# Patient Record
Sex: Male | Born: 1960 | Race: White | Hispanic: No | Marital: Married | Smoking: Never smoker
Health system: Southern US, Community
[De-identification: ages and names within clinical notes are randomized; demographics above are authoritative.]

## PROBLEM LIST (undated history)

## (undated) DIAGNOSIS — J45909 Unspecified asthma, uncomplicated: Secondary | ICD-10-CM

## (undated) DIAGNOSIS — L509 Urticaria, unspecified: Secondary | ICD-10-CM

## (undated) DIAGNOSIS — J309 Allergic rhinitis, unspecified: Secondary | ICD-10-CM

## (undated) DIAGNOSIS — I10 Essential (primary) hypertension: Secondary | ICD-10-CM

## (undated) HISTORY — PX: TONSILLECTOMY: SUR1361

## (undated) HISTORY — PX: ADENOIDECTOMY: SUR15

## (undated) HISTORY — DX: Allergic rhinitis, unspecified: J30.9

## (undated) HISTORY — DX: Essential (primary) hypertension: I10

## (undated) HISTORY — PX: TESTICLE SURGERY: SHX794

## (undated) HISTORY — PX: HERNIA REPAIR: SHX51

## (undated) HISTORY — DX: Unspecified asthma, uncomplicated: J45.909

## (undated) HISTORY — DX: Urticaria, unspecified: L50.9

---

## 2015-02-22 DIAGNOSIS — J309 Allergic rhinitis, unspecified: Secondary | ICD-10-CM

## 2015-02-22 DIAGNOSIS — J3089 Other allergic rhinitis: Secondary | ICD-10-CM | POA: Insufficient documentation

## 2015-02-22 DIAGNOSIS — J4520 Mild intermittent asthma, uncomplicated: Secondary | ICD-10-CM

## 2015-03-22 ENCOUNTER — Ambulatory Visit (INDEPENDENT_AMBULATORY_CARE_PROVIDER_SITE_OTHER): Payer: Medicare Other

## 2015-03-22 DIAGNOSIS — J309 Allergic rhinitis, unspecified: Secondary | ICD-10-CM

## 2015-03-29 ENCOUNTER — Ambulatory Visit (INDEPENDENT_AMBULATORY_CARE_PROVIDER_SITE_OTHER): Payer: Medicare Other

## 2015-03-29 DIAGNOSIS — J309 Allergic rhinitis, unspecified: Secondary | ICD-10-CM | POA: Diagnosis not present

## 2015-04-10 ENCOUNTER — Ambulatory Visit (INDEPENDENT_AMBULATORY_CARE_PROVIDER_SITE_OTHER): Payer: Medicare Other | Admitting: *Deleted

## 2015-04-10 DIAGNOSIS — J309 Allergic rhinitis, unspecified: Secondary | ICD-10-CM | POA: Diagnosis not present

## 2015-05-01 ENCOUNTER — Ambulatory Visit (INDEPENDENT_AMBULATORY_CARE_PROVIDER_SITE_OTHER): Payer: Medicare Other

## 2015-05-01 DIAGNOSIS — J309 Allergic rhinitis, unspecified: Secondary | ICD-10-CM | POA: Diagnosis not present

## 2015-05-18 ENCOUNTER — Ambulatory Visit (INDEPENDENT_AMBULATORY_CARE_PROVIDER_SITE_OTHER): Payer: Medicare Other | Admitting: *Deleted

## 2015-05-18 DIAGNOSIS — J309 Allergic rhinitis, unspecified: Secondary | ICD-10-CM | POA: Diagnosis not present

## 2015-05-26 ENCOUNTER — Encounter: Payer: Self-pay | Admitting: Internal Medicine

## 2015-05-26 ENCOUNTER — Ambulatory Visit (INDEPENDENT_AMBULATORY_CARE_PROVIDER_SITE_OTHER): Payer: Medicare Other | Admitting: Internal Medicine

## 2015-05-26 VITALS — BP 144/96 | HR 80 | Temp 98.0°F | Resp 16 | Ht 66.93 in | Wt 277.3 lb

## 2015-05-26 DIAGNOSIS — J4520 Mild intermittent asthma, uncomplicated: Secondary | ICD-10-CM | POA: Diagnosis not present

## 2015-05-26 DIAGNOSIS — J301 Allergic rhinitis due to pollen: Secondary | ICD-10-CM | POA: Diagnosis not present

## 2015-05-26 MED ORDER — ALBUTEROL SULFATE HFA 108 (90 BASE) MCG/ACT IN AERS: 2.0000 | INHALATION_SPRAY | Freq: Four times a day (QID) | RESPIRATORY_TRACT | Status: DC | PRN

## 2015-05-26 MED ORDER — ALBUTEROL SULFATE HFA 108 (90 BASE) MCG/ACT IN AERS
2.0000 | INHALATION_SPRAY | RESPIRATORY_TRACT | Status: DC | PRN
Start: 2015-05-26 — End: 2016-06-11

## 2015-05-26 NOTE — Progress Notes (Signed)
History of Present Illness: Luis Richardson is a 54 y.o. male presenting for follow-up.  HPI Comments: Allergic rhinitis on immunotherapy: Maintenance reached 07/13/2009. He has not had any shot reactions. He gets injections every 2 weeks because when he goes every 3 weeks, he has increase in rhinitis symptoms. He reports the spring and fall continue to be problematic seasons for him.  Asthma: Symptoms have been stable without any severe exacerbations. He uses albuterol a few times a week for when he is outside working in his dusty garage.   Assessment and Plan: Allergic rhinitis  On immunotherapy, with persistent symptoms especially during the spring and fall  Return for updated allergy skin testing. Stop cetirizine (Zyrtec) 3 days prior.  We'll reformulate shots if necessary  Has EpiPen and action plan-educated on use  For now, continue cetirizine 10 mg daily and fluticasone 2 sprays each nostril daily. Stop fluticasone for any nosebleeds. Use Vaseline to nostrils for bleeding  Intermittent asthma  Intermittent, currently well controlled  Continue as needed Pro Air    Return For updated allergy testing. Please remember to stop antihistamines 3 days prior.  Medications ordered this encounter:  Meds ordered this encounter  Medications  . metFORMIN (GLUCOPHAGE) 500 MG tablet    Sig: TAKE 1 TABLET BY MOUTH EVERY EVENING FORDIABETES  . chlorthalidone (HYGROTON) 25 MG tablet    Sig: Reported on 05/26/2015  . topiramate (TOPAMAX) 50 MG tablet    Sig:   . NONFORMULARY OR COMPOUNDED ITEM    Sig:      Diagnostics: Spirometry: FEV1 3.01 L or 88 %, FEV1/FVC  82 %.  This is a normal study.  Physical Exam: BP 144/96 mmHg  Pulse 80  Temp(Src) 98 F (36.7 C) (Oral)  Resp 16  Ht 5' 6.93" (1.7 m)  Wt 277 lb 5.4 oz (125.8 kg)  BMI 43.53 kg/m2   Physical Exam  Constitutional: He appears well-developed.  HENT:  Nose: Nose normal.  Mouth/Throat: Oropharynx is clear and moist.   Hearing aid in the right ear; hard of hearing  Eyes: Conjunctivae are normal.  Cardiovascular: Normal rate, regular rhythm and normal heart sounds.   No murmur heard. Pulmonary/Chest: Effort normal and breath sounds normal. No respiratory distress. He has no wheezes.  Abdominal: Soft. Bowel sounds are normal.  Musculoskeletal: He exhibits no edema.  Lymphadenopathy:    He has no cervical adenopathy.  Neurological: He is alert.  Skin: No rash noted.  Vitals reviewed.   Medications: Current outpatient prescriptions:  .  albuterol (PROAIR HFA) 108 (90 BASE) MCG/ACT inhaler, Inhale 2 puffs into the lungs every 6 (six) hours as needed for wheezing or shortness of breath., Disp: , Rfl:  .  Azelastine-Fluticasone (DYMISTA) 137-50 MCG/ACT SUSP, Place 2 sprays into the nose at bedtime. , Disp: , Rfl:  .  cetirizine (ZYRTEC) 10 MG tablet, Take 10 mg by mouth daily., Disp: , Rfl:  .  citalopram (CELEXA) 40 MG tablet, Take 40 mg by mouth daily., Disp: , Rfl:  .  EPINEPHrine (EPIPEN 2-PAK) 0.3 mg/0.3 mL IJ SOAJ injection, Inject 0.3 mg into the muscle Once PRN., Disp: , Rfl:  .  metFORMIN (GLUCOPHAGE) 500 MG tablet, TAKE 1 TABLET BY MOUTH EVERY EVENING FORDIABETES, Disp: , Rfl:  .  NONFORMULARY OR COMPOUNDED ITEM, , Disp: , Rfl:  .  simvastatin (ZOCOR) 20 MG tablet, Take 20 mg by mouth daily., Disp: , Rfl:  .  topiramate (TOPAMAX) 50 MG tablet, , Disp: , Rfl:  .  chlorthalidone (HYGROTON) 25 MG tablet, Reported on 05/26/2015, Disp: , Rfl:  .  fluticasone (FLONASE) 50 MCG/ACT nasal spray, Place 2 sprays into both nostrils daily. Reported on 05/26/2015, Disp: , Rfl:  .  indapamide (LOZOL) 1.25 MG tablet, Take 1.25 mg by mouth daily. Reported on 05/26/2015, Disp: , Rfl:   Drug Allergies:  Allergies  Allergen Reactions  . Penicillins     ROS: Per HPI unless specifically indicated below Review of Systems  Thank you for the opportunity to care for this patient.  Please do not hesitate to contact  me with questions.

## 2015-05-26 NOTE — Patient Instructions (Signed)
Allergic rhinitis  On immunotherapy, with persistent symptoms especially during the spring and fall  Return for updated allergy skin testing. Stop cetirizine (Zyrtec) 3 days prior.  We'll reformulate shots if necessary  Has EpiPen and action plan-educated on use  For now, continue cetirizine 10 mg daily and fluticasone 2 sprays each nostril daily. Stop fluticasone for any nosebleeds. Use Vaseline to nostrils for bleeding  Intermittent asthma  Intermittent, currently well controlled  Continue as needed [employer] Air

## 2015-05-26 NOTE — Assessment & Plan Note (Signed)
   On immunotherapy, with persistent symptoms especially during the spring and fall  Return for updated allergy skin testing. Stop cetirizine (Zyrtec) 3 days prior.  We'll reformulate shots if necessary  Has EpiPen and action plan-educated on use  For now, continue cetirizine 10 mg daily and fluticasone 2 sprays each nostril daily. Stop fluticasone for any nosebleeds. Use Vaseline to nostrils for bleeding

## 2015-05-26 NOTE — Assessment & Plan Note (Signed)
   Intermittent, currently well controlled  Continue as needed Pro Air. 

## 2015-06-01 ENCOUNTER — Ambulatory Visit (INDEPENDENT_AMBULATORY_CARE_PROVIDER_SITE_OTHER): Payer: Medicare Other | Admitting: *Deleted

## 2015-06-01 DIAGNOSIS — J309 Allergic rhinitis, unspecified: Secondary | ICD-10-CM

## 2015-06-09 ENCOUNTER — Other Ambulatory Visit: Payer: Self-pay | Admitting: Internal Medicine

## 2015-06-13 ENCOUNTER — Ambulatory Visit (INDEPENDENT_AMBULATORY_CARE_PROVIDER_SITE_OTHER): Payer: Medicare Other | Admitting: *Deleted

## 2015-06-13 DIAGNOSIS — J309 Allergic rhinitis, unspecified: Secondary | ICD-10-CM

## 2015-06-13 DIAGNOSIS — I10 Essential (primary) hypertension: Secondary | ICD-10-CM | POA: Insufficient documentation

## 2015-06-13 DIAGNOSIS — G4733 Obstructive sleep apnea (adult) (pediatric): Secondary | ICD-10-CM | POA: Insufficient documentation

## 2015-06-13 DIAGNOSIS — R040 Epistaxis: Secondary | ICD-10-CM | POA: Insufficient documentation

## 2015-06-19 ENCOUNTER — Ambulatory Visit: Payer: Medicare Other | Admitting: Internal Medicine

## 2015-06-19 DIAGNOSIS — Z6841 Body Mass Index (BMI) 40.0 and over, adult: Secondary | ICD-10-CM | POA: Insufficient documentation

## 2015-06-19 DIAGNOSIS — F5104 Psychophysiologic insomnia: Secondary | ICD-10-CM | POA: Insufficient documentation

## 2015-06-19 DIAGNOSIS — H9193 Unspecified hearing loss, bilateral: Secondary | ICD-10-CM | POA: Insufficient documentation

## 2015-06-19 DIAGNOSIS — M1991 Primary osteoarthritis, unspecified site: Secondary | ICD-10-CM | POA: Insufficient documentation

## 2015-06-19 DIAGNOSIS — E119 Type 2 diabetes mellitus without complications: Secondary | ICD-10-CM | POA: Insufficient documentation

## 2015-06-19 DIAGNOSIS — G44229 Chronic tension-type headache, not intractable: Secondary | ICD-10-CM | POA: Insufficient documentation

## 2015-06-19 DIAGNOSIS — E782 Mixed hyperlipidemia: Secondary | ICD-10-CM | POA: Insufficient documentation

## 2015-07-03 ENCOUNTER — Encounter: Payer: Self-pay | Admitting: Internal Medicine

## 2015-07-03 ENCOUNTER — Ambulatory Visit (INDEPENDENT_AMBULATORY_CARE_PROVIDER_SITE_OTHER): Payer: Medicare Other | Admitting: Internal Medicine

## 2015-07-03 VITALS — BP 140/76 | HR 76 | Temp 97.8°F | Resp 16

## 2015-07-03 DIAGNOSIS — T7800XA Anaphylactic reaction due to unspecified food, initial encounter: Secondary | ICD-10-CM

## 2015-07-03 DIAGNOSIS — J4520 Mild intermittent asthma, uncomplicated: Secondary | ICD-10-CM

## 2015-07-03 DIAGNOSIS — J309 Allergic rhinitis, unspecified: Secondary | ICD-10-CM | POA: Diagnosis not present

## 2015-07-03 MED ORDER — MONTELUKAST SODIUM 10 MG PO TABS: 10.0000 mg | ORAL_TABLET | Freq: Every day | ORAL | Status: DC

## 2015-07-03 NOTE — Progress Notes (Signed)
History of Present Illness: Luis Richardson is a 55 y.o. male presenting for updated allergy testing.  HPI Comments: Allergic rhinitis on immunotherapy: Maintenance reached 07/13/2009. Please see last office visit for full details. He is here today to update his allergy testing due to continued breakthrough symptoms despite having completed a 6 year course.  Asthma: Symptoms have been stable without any severe exacerbations. He uses albuterol a few times a week for when he is outside working in his dusty garage.   Assessment and Plan: Allergic rhinitis  On immunotherapy, with persistent symptoms especially during the spring and fall.  Reformulate allergy vaccine  Has EpiPen and action plan-educated on use  Continue cetirizine 10 mg daily and fluticasone 2 sprays each nostril daily. Stop fluticasone for any nosebleeds. Use Vaseline to nostrils for bleeding  Start Singulair (montelukast) 10 mg daily as needed in the spring and fall for breakthrough allergy symptoms  Intermittent asthma  Intermittent, currently well controlled  Continue as needed Pro Air   Allergy with anaphylaxis due to food  Strict avoidance of oranges, lemon, grapes  Given EpiPen and action plan-educated on use    Return in about 6 months (around 12/31/2015).  Medications ordered this encounter:  Meds ordered this encounter  Medications  . azelastine (OPTIVAR) 0.05 % ophthalmic solution    Sig: Place 1 drop into both eyes 2 times daily.  Letta Pate DELICA LANCETS 33G MISC    Sig: CHECK BLOOD SUGAR ONCE PER DAY OR AS DIRECTED (DX:E11.9)  . glucose blood (ONE TOUCH ULTRA TEST) test strip    Sig: CHECK BLOOD SUGAR ONCE PER DAY OR AS DIRECTED (DX:E11.9)  . mupirocin ointment (BACTROBAN) 2 %    Sig: Apply small dab twice daily as instructed for 10 days in both sides of the nose  . DISCONTD: azelastine (OPTIVAR) 0.05 % ophthalmic solution    Sig:   . montelukast (SINGULAIR) 10 MG tablet    Sig: Take 1 tablet  (10 mg total) by mouth at bedtime.    Dispense:  30 tablet    Refill:  5    For cough or wheeze    Diagnostics: Spirometry: FEV1 3.04L or 85%, FEV1/FVC  80%.  This is a normal study Aeroallergen skin testing: Positive for grass, weed, mold, dust, cat, cockroach with a good histamine control Food allergy skin testing: Positive for orange and grape with a good histamine control  Physical Exam: BP 140/76 mmHg  Pulse 76  Temp(Src) 97.8 F (36.6 C) (Oral)  Resp 16   Physical Exam  Constitutional: He appears well-developed.  HENT:  Nose: Nose normal.  Mouth/Throat: Oropharynx is clear and moist.  Poor hearing  Eyes: Conjunctivae are normal.  Cardiovascular: Normal rate, regular rhythm and normal heart sounds.   No murmur heard. Pulmonary/Chest: Effort normal and breath sounds normal. No respiratory distress. He has no wheezes.  Abdominal: Soft. Bowel sounds are normal.  Musculoskeletal: He exhibits no edema.  Lymphadenopathy:    He has no cervical adenopathy.  Neurological: He is alert.  Skin: No rash noted.  Vitals reviewed.   Medications: Current outpatient prescriptions:  .  albuterol (PROAIR HFA) 108 (90 BASE) MCG/ACT inhaler, Inhale 2 puffs into the lungs every 4 (four) hours as needed for wheezing or shortness of breath., Disp: 1 Inhaler, Rfl: 1 .  azelastine (OPTIVAR) 0.05 % ophthalmic solution, Place 1 drop into both eyes 2 times daily., Disp: , Rfl:  .  cetirizine (ZYRTEC) 10 MG tablet, TAKE 1 TABLET BY MOUTH EVERY  DAY FOR RUNNY NOSE OR ITCHING, Disp: 30 tablet, Rfl: 0 .  chlorthalidone (HYGROTON) 25 MG tablet, Reported on 05/26/2015, Disp: , Rfl:  .  citalopram (CELEXA) 40 MG tablet, Take 40 mg by mouth daily., Disp: , Rfl:  .  EPINEPHrine (EPIPEN 2-PAK) 0.3 mg/0.3 mL IJ SOAJ injection, Inject 0.3 mg into the muscle Once PRN., Disp: , Rfl:  .  fluticasone (FLONASE) 50 MCG/ACT nasal spray, ONE SPRAY IN EACH NOSTRIL TWICE A DAY, Disp: 16 g, Rfl: 0 .  glucose blood (ONE  TOUCH ULTRA TEST) test strip, CHECK BLOOD SUGAR ONCE PER DAY OR AS DIRECTED (DX:E11.9), Disp: , Rfl:  .  indapamide (LOZOL) 1.25 MG tablet, Take 1.25 mg by mouth daily. Reported on 07/03/2015, Disp: , Rfl:  .  metFORMIN (GLUCOPHAGE) 500 MG tablet, TAKE 1 TABLET BY MOUTH EVERY EVENING FORDIABETES, Disp: , Rfl:  .  mupirocin ointment (BACTROBAN) 2 %, Apply small dab twice daily as instructed for 10 days in both sides of the nose, Disp: , Rfl:  .  NONFORMULARY OR COMPOUNDED ITEM, , Disp: , Rfl:  .  ONETOUCH DELICA LANCETS 33G MISC, CHECK BLOOD SUGAR ONCE PER DAY OR AS DIRECTED (DX:E11.9), Disp: , Rfl:  .  simvastatin (ZOCOR) 20 MG tablet, Take 20 mg by mouth daily., Disp: , Rfl:  .  Azelastine-Fluticasone (DYMISTA) 137-50 MCG/ACT SUSP, Place 2 sprays into the nose at bedtime. Reported on 07/03/2015, Disp: , Rfl:  .  montelukast (SINGULAIR) 10 MG tablet, Take 1 tablet (10 mg total) by mouth at bedtime., Disp: 30 tablet, Rfl: 5 .  topiramate (TOPAMAX) 50 MG tablet, Reported on 07/03/2015, Disp: , Rfl:   Drug Allergies:  Allergies  Allergen Reactions  . Penicillins   . Topiramate Other (See Comments)    ROS: Per HPI unless specifically indicated below Review of Systems  Thank you for the opportunity to care for this patient.  Please do not hesitate to contact me with questions.

## 2015-07-03 NOTE — Assessment & Plan Note (Addendum)
   Intermittent, currently well controlled  Continue as needed Pro Air. 

## 2015-07-03 NOTE — Assessment & Plan Note (Addendum)
   On immunotherapy, with persistent symptoms especially during the spring and fall.  Reformulate allergy vaccine  Has EpiPen and action plan-educated on use  Continue cetirizine 10 mg daily and fluticasone 2 sprays each nostril daily. Stop fluticasone for any nosebleeds. Use Vaseline to nostrils for bleeding  Start Singulair (montelukast) 10 mg daily as needed in the spring and fall for breakthrough allergy symptoms

## 2015-07-03 NOTE — Patient Instructions (Signed)
Allergic rhinitis  On immunotherapy, with persistent symptoms especially during the spring and fall.  Reformulate allergy vaccine  Has EpiPen and action plan-educated on use  Continue cetirizine 10 mg daily and fluticasone 2 sprays each nostril daily. Stop fluticasone for any nosebleeds. Use Vaseline to nostrils for bleeding  Start Singulair (montelukast) 10 mg daily as needed in the spring and fall for breakthrough allergy symptoms  Intermittent asthma  Intermittent, currently well controlled  Continue as needed Pro Air   Allergy with anaphylaxis due to food  Strict avoidance of oranges, lemon, grapes  Given EpiPen and action plan-educated on use

## 2015-07-03 NOTE — Assessment & Plan Note (Signed)
   Strict avoidance of oranges, lemon, grapes  Given EpiPen and action plan-educated on use

## 2015-07-05 ENCOUNTER — Ambulatory Visit (INDEPENDENT_AMBULATORY_CARE_PROVIDER_SITE_OTHER): Payer: Medicare Other | Admitting: *Deleted

## 2015-07-05 DIAGNOSIS — J309 Allergic rhinitis, unspecified: Secondary | ICD-10-CM

## 2015-07-06 DIAGNOSIS — J3089 Other allergic rhinitis: Secondary | ICD-10-CM | POA: Diagnosis not present

## 2015-07-06 DIAGNOSIS — H903 Sensorineural hearing loss, bilateral: Secondary | ICD-10-CM | POA: Insufficient documentation

## 2015-07-07 DIAGNOSIS — J301 Allergic rhinitis due to pollen: Secondary | ICD-10-CM | POA: Diagnosis not present

## 2015-07-20 ENCOUNTER — Ambulatory Visit: Payer: Medicare Other

## 2015-07-20 ENCOUNTER — Ambulatory Visit (INDEPENDENT_AMBULATORY_CARE_PROVIDER_SITE_OTHER): Payer: Medicare Other

## 2015-07-20 DIAGNOSIS — J309 Allergic rhinitis, unspecified: Secondary | ICD-10-CM | POA: Diagnosis not present

## 2015-07-26 ENCOUNTER — Ambulatory Visit (INDEPENDENT_AMBULATORY_CARE_PROVIDER_SITE_OTHER): Payer: Medicare Other

## 2015-07-26 DIAGNOSIS — J309 Allergic rhinitis, unspecified: Secondary | ICD-10-CM | POA: Diagnosis not present

## 2015-08-02 ENCOUNTER — Ambulatory Visit (INDEPENDENT_AMBULATORY_CARE_PROVIDER_SITE_OTHER): Payer: Medicare Other

## 2015-08-02 DIAGNOSIS — J309 Allergic rhinitis, unspecified: Secondary | ICD-10-CM

## 2015-08-02 NOTE — Progress Notes (Signed)
Immunotherapy   Patient Details  Name: Luis Richardson MRN: [id number] Date of Birth: Jun 13, 1960  08/02/2015  Luis Richardson started injections for  BLUE 1:100,000 (GRASS-CAT AND WEED-MOLD-MITE-CR) Following schedule: A  Frequency:1 time per week Epi-Pen:Epi-Pen Avaiable  Consent signed and patient instructions given. No problems after 30 minute wait  Luis Richardson 08/02/2015, 2:07 PM

## 2015-08-09 ENCOUNTER — Ambulatory Visit (INDEPENDENT_AMBULATORY_CARE_PROVIDER_SITE_OTHER): Payer: Medicare Other

## 2015-08-09 DIAGNOSIS — J309 Allergic rhinitis, unspecified: Secondary | ICD-10-CM | POA: Diagnosis not present

## 2015-08-16 ENCOUNTER — Ambulatory Visit (INDEPENDENT_AMBULATORY_CARE_PROVIDER_SITE_OTHER): Payer: Medicare Other | Admitting: *Deleted

## 2015-08-16 DIAGNOSIS — J309 Allergic rhinitis, unspecified: Secondary | ICD-10-CM

## 2015-08-23 ENCOUNTER — Ambulatory Visit (INDEPENDENT_AMBULATORY_CARE_PROVIDER_SITE_OTHER): Payer: Medicare Other

## 2015-08-23 DIAGNOSIS — J309 Allergic rhinitis, unspecified: Secondary | ICD-10-CM | POA: Diagnosis not present

## 2015-08-30 ENCOUNTER — Ambulatory Visit (INDEPENDENT_AMBULATORY_CARE_PROVIDER_SITE_OTHER): Payer: Medicare Other | Admitting: *Deleted

## 2015-08-30 DIAGNOSIS — J309 Allergic rhinitis, unspecified: Secondary | ICD-10-CM | POA: Diagnosis not present

## 2015-09-06 ENCOUNTER — Ambulatory Visit (INDEPENDENT_AMBULATORY_CARE_PROVIDER_SITE_OTHER): Payer: Medicare Other

## 2015-09-06 DIAGNOSIS — J309 Allergic rhinitis, unspecified: Secondary | ICD-10-CM

## 2015-09-11 DIAGNOSIS — F419 Anxiety disorder, unspecified: Secondary | ICD-10-CM | POA: Insufficient documentation

## 2015-09-13 ENCOUNTER — Ambulatory Visit (INDEPENDENT_AMBULATORY_CARE_PROVIDER_SITE_OTHER): Payer: Medicare Other | Admitting: *Deleted

## 2015-09-13 DIAGNOSIS — J309 Allergic rhinitis, unspecified: Secondary | ICD-10-CM | POA: Diagnosis not present

## 2015-09-20 ENCOUNTER — Ambulatory Visit (INDEPENDENT_AMBULATORY_CARE_PROVIDER_SITE_OTHER): Payer: Medicare Other

## 2015-09-20 DIAGNOSIS — J309 Allergic rhinitis, unspecified: Secondary | ICD-10-CM

## 2015-09-27 ENCOUNTER — Ambulatory Visit (INDEPENDENT_AMBULATORY_CARE_PROVIDER_SITE_OTHER): Payer: Medicare Other

## 2015-09-27 DIAGNOSIS — J309 Allergic rhinitis, unspecified: Secondary | ICD-10-CM | POA: Diagnosis not present

## 2015-10-04 ENCOUNTER — Ambulatory Visit (INDEPENDENT_AMBULATORY_CARE_PROVIDER_SITE_OTHER): Payer: Medicare Other

## 2015-10-04 DIAGNOSIS — J309 Allergic rhinitis, unspecified: Secondary | ICD-10-CM

## 2015-10-11 ENCOUNTER — Ambulatory Visit (INDEPENDENT_AMBULATORY_CARE_PROVIDER_SITE_OTHER): Payer: Medicare Other

## 2015-10-11 DIAGNOSIS — J309 Allergic rhinitis, unspecified: Secondary | ICD-10-CM | POA: Diagnosis not present

## 2015-10-18 ENCOUNTER — Ambulatory Visit (INDEPENDENT_AMBULATORY_CARE_PROVIDER_SITE_OTHER): Payer: Medicare Other

## 2015-10-18 DIAGNOSIS — J309 Allergic rhinitis, unspecified: Secondary | ICD-10-CM

## 2015-10-25 ENCOUNTER — Ambulatory Visit (INDEPENDENT_AMBULATORY_CARE_PROVIDER_SITE_OTHER): Payer: Medicare Other

## 2015-10-25 DIAGNOSIS — J309 Allergic rhinitis, unspecified: Secondary | ICD-10-CM

## 2015-11-01 ENCOUNTER — Ambulatory Visit (INDEPENDENT_AMBULATORY_CARE_PROVIDER_SITE_OTHER): Payer: Medicare Other

## 2015-11-01 DIAGNOSIS — J309 Allergic rhinitis, unspecified: Secondary | ICD-10-CM | POA: Diagnosis not present

## 2015-11-08 ENCOUNTER — Ambulatory Visit (INDEPENDENT_AMBULATORY_CARE_PROVIDER_SITE_OTHER): Payer: Medicare Other

## 2015-11-08 DIAGNOSIS — J309 Allergic rhinitis, unspecified: Secondary | ICD-10-CM

## 2015-11-09 ENCOUNTER — Other Ambulatory Visit: Payer: Self-pay | Admitting: Allergy

## 2015-11-09 MED ORDER — MONTELUKAST SODIUM 10 MG PO TABS: 10.0000 mg | ORAL_TABLET | Freq: Every day | ORAL | Status: DC

## 2015-11-15 ENCOUNTER — Ambulatory Visit (INDEPENDENT_AMBULATORY_CARE_PROVIDER_SITE_OTHER): Payer: Medicare Other

## 2015-11-15 DIAGNOSIS — J309 Allergic rhinitis, unspecified: Secondary | ICD-10-CM | POA: Diagnosis not present

## 2015-11-22 ENCOUNTER — Ambulatory Visit (INDEPENDENT_AMBULATORY_CARE_PROVIDER_SITE_OTHER): Payer: Medicare Other

## 2015-11-22 DIAGNOSIS — J309 Allergic rhinitis, unspecified: Secondary | ICD-10-CM

## 2015-11-29 ENCOUNTER — Ambulatory Visit (INDEPENDENT_AMBULATORY_CARE_PROVIDER_SITE_OTHER): Payer: Medicare Other

## 2015-11-29 DIAGNOSIS — J309 Allergic rhinitis, unspecified: Secondary | ICD-10-CM | POA: Diagnosis not present

## 2015-12-06 ENCOUNTER — Ambulatory Visit (INDEPENDENT_AMBULATORY_CARE_PROVIDER_SITE_OTHER): Payer: Medicare Other

## 2015-12-06 DIAGNOSIS — J309 Allergic rhinitis, unspecified: Secondary | ICD-10-CM

## 2015-12-13 ENCOUNTER — Ambulatory Visit (INDEPENDENT_AMBULATORY_CARE_PROVIDER_SITE_OTHER): Payer: Medicare Other

## 2015-12-13 DIAGNOSIS — J309 Allergic rhinitis, unspecified: Secondary | ICD-10-CM | POA: Diagnosis not present

## 2015-12-19 DIAGNOSIS — M1990 Unspecified osteoarthritis, unspecified site: Secondary | ICD-10-CM | POA: Insufficient documentation

## 2015-12-19 DIAGNOSIS — E291 Testicular hypofunction: Secondary | ICD-10-CM | POA: Insufficient documentation

## 2015-12-20 ENCOUNTER — Ambulatory Visit (INDEPENDENT_AMBULATORY_CARE_PROVIDER_SITE_OTHER): Payer: Medicare Other

## 2015-12-20 DIAGNOSIS — J309 Allergic rhinitis, unspecified: Secondary | ICD-10-CM | POA: Diagnosis not present

## 2015-12-25 ENCOUNTER — Ambulatory Visit: Payer: Medicare Other | Admitting: Pediatrics

## 2015-12-26 DIAGNOSIS — L918 Other hypertrophic disorders of the skin: Secondary | ICD-10-CM | POA: Insufficient documentation

## 2015-12-27 ENCOUNTER — Ambulatory Visit (INDEPENDENT_AMBULATORY_CARE_PROVIDER_SITE_OTHER): Payer: Medicare Other

## 2015-12-27 DIAGNOSIS — J309 Allergic rhinitis, unspecified: Secondary | ICD-10-CM

## 2016-01-01 ENCOUNTER — Ambulatory Visit: Payer: Medicare Other | Admitting: Pediatrics

## 2016-01-03 ENCOUNTER — Ambulatory Visit (INDEPENDENT_AMBULATORY_CARE_PROVIDER_SITE_OTHER): Payer: Medicare Other

## 2016-01-03 DIAGNOSIS — J309 Allergic rhinitis, unspecified: Secondary | ICD-10-CM

## 2016-01-09 ENCOUNTER — Encounter: Payer: Self-pay | Admitting: Pediatrics

## 2016-01-09 ENCOUNTER — Ambulatory Visit (INDEPENDENT_AMBULATORY_CARE_PROVIDER_SITE_OTHER): Payer: Medicare Other | Admitting: Pediatrics

## 2016-01-09 VITALS — BP 144/94 | HR 64 | Temp 98.2°F | Resp 16

## 2016-01-09 DIAGNOSIS — J301 Allergic rhinitis due to pollen: Secondary | ICD-10-CM

## 2016-01-09 DIAGNOSIS — J309 Allergic rhinitis, unspecified: Secondary | ICD-10-CM | POA: Diagnosis not present

## 2016-01-09 DIAGNOSIS — J4530 Mild persistent asthma, uncomplicated: Secondary | ICD-10-CM

## 2016-01-09 DIAGNOSIS — J4540 Moderate persistent asthma, uncomplicated: Secondary | ICD-10-CM | POA: Insufficient documentation

## 2016-01-09 DIAGNOSIS — J45901 Unspecified asthma with (acute) exacerbation: Secondary | ICD-10-CM

## 2016-01-09 MED ORDER — EPINEPHRINE 0.3 MG/0.3ML IJ SOAJ: INTRAMUSCULAR | 1 refills | Status: DC

## 2016-01-09 MED ORDER — FLUTICASONE PROPIONATE 50 MCG/ACT NA SUSP: 2.0000 | Freq: Every day | NASAL | 3 refills | Status: DC

## 2016-01-09 NOTE — Progress Notes (Signed)
  [address] [place] [address] Dept: [phone]  FOLLOW UP NOTE  Patient ID: Luis Richardson, male    DOB: August 28, 1960  Age: 55 y.o. MRN: [id number] Date of Office Visit: 01/09/2016  Assessment  Chief Complaint: Asthma (doing  no cough or wheeze the last 2 weeks)  HPI [name] presents for follow-up of asthma and allergic rhinitis. His asthma is well controlled. His allergic rhinitis is well controlled with allergy injections. He is on green vials of allergy extract.  Current medications cetirizine 10 mg once a day, Pro-air 2 puffs every 4 hours if needed, montelukast 10 mg once a day, fluticasone 2 sprays per nostril once a day if needed and azelastine 0.05% one drop in each eye twice a day if needed.   Drug Allergies:  Allergies  Allergen Reactions  . Penicillins   . Topiramate Other (See Comments)    Physical Exam: BP (!) 144/94 (BP Location: Left Arm, Patient Position: Sitting, Cuff Size: Large)   Pulse 64   Temp 98.2 F (36.8 C) (Oral)   Resp 16    Physical Exam  Constitutional: He is oriented to person, place, and time. He appears well-developed and well-nourished.  HENT:  Eyes normal. Ears normal. Nose normal. Pharynx normal.  Neck: Neck supple.  Cardiovascular:  S1 and S2 normal no murmurs  Pulmonary/Chest:  Clear to percussion and auscultation  Lymphadenopathy:    He has no cervical adenopathy.  Neurological: He is alert and oriented to person, place, and time.  Psychiatric: He has a normal mood and affect. His behavior is normal. Judgment and thought content normal.  Vitals reviewed.   Diagnostics:  FVC 3.52 L FEV1 2.87 L. Predicted FVC 4.42 L predicted FEV1 3.39 L-this shows a minimal reduction in the forced vital capacity  Assessment and Plan: 1. Mild persistent asthma, uncomplicated   2. Allergic rhinitis, unspecified allergic rhinitis type   3. Allergic rhinitis due to pollen     Meds ordered this encounter  Medications  .  fluticasone (FLONASE) 50 MCG/ACT nasal spray    Sig: Place 2 sprays into both nostrils daily.    Dispense:  48 g    Refill:  3    For stuffy nose or drainage. Please dispense 90 day supply.  Marland Kitchen EPINEPHrine 0.3 mg/0.3 mL IJ SOAJ injection    Sig: Use as directed for severe allergic reaction    Dispense:  1 Device    Refill:  1    Dispense mylan generic brand only. Pt. Has coupon.    Patient Instructions  Continue on his current medications Call me if he is not doing well on this treatment plan   Return in about 1 year (around 01/08/2017).    Thank you for the opportunity to care for this patient.  Please do not hesitate to contact me with questions.  Tonette Bihari, M.D.  [facility] [address] [place], [place] [address] [phone]

## 2016-01-09 NOTE — Patient Instructions (Signed)
Continue on his current medications Call me if he is not doing well on this treatment plan  

## 2016-01-16 ENCOUNTER — Ambulatory Visit (INDEPENDENT_AMBULATORY_CARE_PROVIDER_SITE_OTHER): Payer: Medicare Other

## 2016-01-16 DIAGNOSIS — J309 Allergic rhinitis, unspecified: Secondary | ICD-10-CM | POA: Diagnosis not present

## 2016-01-23 ENCOUNTER — Ambulatory Visit (INDEPENDENT_AMBULATORY_CARE_PROVIDER_SITE_OTHER): Payer: Medicare Other

## 2016-01-23 DIAGNOSIS — J309 Allergic rhinitis, unspecified: Secondary | ICD-10-CM

## 2016-01-30 ENCOUNTER — Ambulatory Visit (INDEPENDENT_AMBULATORY_CARE_PROVIDER_SITE_OTHER): Payer: Medicare Other

## 2016-01-30 DIAGNOSIS — J309 Allergic rhinitis, unspecified: Secondary | ICD-10-CM

## 2016-02-02 DIAGNOSIS — I493 Ventricular premature depolarization: Secondary | ICD-10-CM | POA: Insufficient documentation

## 2016-02-13 ENCOUNTER — Ambulatory Visit (INDEPENDENT_AMBULATORY_CARE_PROVIDER_SITE_OTHER): Payer: Medicare Other

## 2016-02-13 DIAGNOSIS — J309 Allergic rhinitis, unspecified: Secondary | ICD-10-CM | POA: Diagnosis not present

## 2016-02-20 ENCOUNTER — Ambulatory Visit (INDEPENDENT_AMBULATORY_CARE_PROVIDER_SITE_OTHER): Payer: Medicare Other

## 2016-02-20 DIAGNOSIS — J309 Allergic rhinitis, unspecified: Secondary | ICD-10-CM

## 2016-02-27 ENCOUNTER — Ambulatory Visit (INDEPENDENT_AMBULATORY_CARE_PROVIDER_SITE_OTHER): Payer: Medicare Other

## 2016-02-27 DIAGNOSIS — J309 Allergic rhinitis, unspecified: Secondary | ICD-10-CM

## 2016-03-05 ENCOUNTER — Ambulatory Visit (INDEPENDENT_AMBULATORY_CARE_PROVIDER_SITE_OTHER): Payer: Medicare Other

## 2016-03-05 DIAGNOSIS — J309 Allergic rhinitis, unspecified: Secondary | ICD-10-CM | POA: Diagnosis not present

## 2016-03-12 ENCOUNTER — Ambulatory Visit (INDEPENDENT_AMBULATORY_CARE_PROVIDER_SITE_OTHER): Payer: Medicare Other

## 2016-03-12 DIAGNOSIS — J309 Allergic rhinitis, unspecified: Secondary | ICD-10-CM | POA: Diagnosis not present

## 2016-03-19 ENCOUNTER — Ambulatory Visit (INDEPENDENT_AMBULATORY_CARE_PROVIDER_SITE_OTHER): Payer: Medicare Other

## 2016-03-19 DIAGNOSIS — J309 Allergic rhinitis, unspecified: Secondary | ICD-10-CM | POA: Diagnosis not present

## 2016-03-26 ENCOUNTER — Ambulatory Visit (INDEPENDENT_AMBULATORY_CARE_PROVIDER_SITE_OTHER): Payer: Medicare Other

## 2016-03-26 DIAGNOSIS — J309 Allergic rhinitis, unspecified: Secondary | ICD-10-CM | POA: Diagnosis not present

## 2016-04-02 ENCOUNTER — Ambulatory Visit (INDEPENDENT_AMBULATORY_CARE_PROVIDER_SITE_OTHER): Payer: Medicare Other | Admitting: *Deleted

## 2016-04-02 DIAGNOSIS — J309 Allergic rhinitis, unspecified: Secondary | ICD-10-CM | POA: Diagnosis not present

## 2016-04-09 ENCOUNTER — Ambulatory Visit (INDEPENDENT_AMBULATORY_CARE_PROVIDER_SITE_OTHER): Payer: Medicare Other

## 2016-04-09 DIAGNOSIS — J309 Allergic rhinitis, unspecified: Secondary | ICD-10-CM | POA: Diagnosis not present

## 2016-04-16 ENCOUNTER — Ambulatory Visit (INDEPENDENT_AMBULATORY_CARE_PROVIDER_SITE_OTHER): Payer: Medicare Other

## 2016-04-16 DIAGNOSIS — J309 Allergic rhinitis, unspecified: Secondary | ICD-10-CM

## 2016-04-23 ENCOUNTER — Ambulatory Visit (INDEPENDENT_AMBULATORY_CARE_PROVIDER_SITE_OTHER): Payer: Medicare Other

## 2016-04-23 DIAGNOSIS — J309 Allergic rhinitis, unspecified: Secondary | ICD-10-CM | POA: Diagnosis not present

## 2016-04-30 ENCOUNTER — Ambulatory Visit (INDEPENDENT_AMBULATORY_CARE_PROVIDER_SITE_OTHER): Payer: Medicare Other

## 2016-04-30 DIAGNOSIS — J309 Allergic rhinitis, unspecified: Secondary | ICD-10-CM | POA: Diagnosis not present

## 2016-05-07 ENCOUNTER — Ambulatory Visit (INDEPENDENT_AMBULATORY_CARE_PROVIDER_SITE_OTHER): Payer: Medicare Other

## 2016-05-07 DIAGNOSIS — J309 Allergic rhinitis, unspecified: Secondary | ICD-10-CM | POA: Diagnosis not present

## 2016-05-14 ENCOUNTER — Ambulatory Visit (INDEPENDENT_AMBULATORY_CARE_PROVIDER_SITE_OTHER): Payer: Medicare Other

## 2016-05-14 DIAGNOSIS — J309 Allergic rhinitis, unspecified: Secondary | ICD-10-CM | POA: Diagnosis not present

## 2016-05-21 ENCOUNTER — Ambulatory Visit (INDEPENDENT_AMBULATORY_CARE_PROVIDER_SITE_OTHER): Payer: Medicare Other

## 2016-05-21 DIAGNOSIS — J309 Allergic rhinitis, unspecified: Secondary | ICD-10-CM

## 2016-05-28 ENCOUNTER — Ambulatory Visit (INDEPENDENT_AMBULATORY_CARE_PROVIDER_SITE_OTHER): Payer: Medicare Other

## 2016-05-28 DIAGNOSIS — J309 Allergic rhinitis, unspecified: Secondary | ICD-10-CM

## 2016-05-30 DIAGNOSIS — J3089 Other allergic rhinitis: Secondary | ICD-10-CM | POA: Diagnosis not present

## 2016-05-31 DIAGNOSIS — J301 Allergic rhinitis due to pollen: Secondary | ICD-10-CM | POA: Diagnosis not present

## 2016-06-05 ENCOUNTER — Ambulatory Visit (INDEPENDENT_AMBULATORY_CARE_PROVIDER_SITE_OTHER): Payer: Medicare Other | Admitting: *Deleted

## 2016-06-05 DIAGNOSIS — J4540 Moderate persistent asthma, uncomplicated: Secondary | ICD-10-CM | POA: Diagnosis not present

## 2016-06-07 ENCOUNTER — Other Ambulatory Visit: Payer: Self-pay | Admitting: *Deleted

## 2016-06-07 MED ORDER — MONTELUKAST SODIUM 10 MG PO TABS: 10.0000 mg | ORAL_TABLET | Freq: Every day | ORAL | 5 refills | Status: DC

## 2016-06-11 ENCOUNTER — Encounter: Payer: Self-pay | Admitting: Allergy

## 2016-06-11 ENCOUNTER — Ambulatory Visit (INDEPENDENT_AMBULATORY_CARE_PROVIDER_SITE_OTHER): Payer: Medicare Other | Admitting: Allergy

## 2016-06-11 VITALS — BP 110/72 | HR 77 | Temp 97.8°F | Resp 16 | Ht 66.0 in

## 2016-06-11 DIAGNOSIS — J4531 Mild persistent asthma with (acute) exacerbation: Secondary | ICD-10-CM | POA: Diagnosis not present

## 2016-06-11 DIAGNOSIS — J00 Acute nasopharyngitis [common cold]: Secondary | ICD-10-CM | POA: Diagnosis not present

## 2016-06-11 MED ORDER — MUPIROCIN 2 % EX OINT: TOPICAL_OINTMENT | CUTANEOUS | 2 refills | Status: DC

## 2016-06-11 MED ORDER — ALBUTEROL SULFATE HFA 108 (90 BASE) MCG/ACT IN AERS: 2.0000 | INHALATION_SPRAY | Freq: Four times a day (QID) | RESPIRATORY_TRACT | 2 refills | Status: DC | PRN

## 2016-06-11 MED ORDER — AZITHROMYCIN 250 MG PO TABS: ORAL_TABLET | ORAL | 0 refills | Status: DC

## 2016-06-11 NOTE — Patient Instructions (Addendum)
Asthma exacerbation with upper respiratory tract infection    - take prednisone 20mg  twice a day x 3 days, then 20mg  x 1 day, then 10mg  x 1 day.    - If you are not feeling better by Thursday then start Azthromycin (z-pak) 500mg  x day 1, then 250mg  x day 2-5    - take your albuterol (proair) 2 puffs every 4 hours for the next 2-3 days then resume as needed use.      - continue your current regimen of Singulair 10mg  daily, Cetirizine 10mg  daily, fluticasone nasal spray 2 sprays each nostril.        Start using your nasal saline rinses followed by your Fluticasone    - let us know if your symptoms worsen/not improving over the next several days.    Follow-up with Dr. Beaulah DinningBardelas in 2-3 months or sooner if needed

## 2016-06-11 NOTE — Progress Notes (Signed)
Follow-up Note  RE: Luis Richardson MRN: [id number] DOB: January 31, 1961 Date of Office Visit: 06/11/2016   History of present illness: Luis Richardson is a 55 y.o. male presenting today for sick visit.  He was last in our office by Dr. Beaulah Dinning for asthma,  Allergic rhinitis. He is on allergen immunotherapy and comes weekly.   He presents today as he has had increased cough as well as chest pain with the cough. Cough wakes him up at night.  He is also wheezing. He started using his ProAir this morning. He also complains of increased nasal drainage that is green he is also coughed up some green mucus. He also feels that there is pressure in his forehead. Last night he reports having some sweats and chills. Symptoms started over the past 2-3 days.   He has continued to take his regular medications which include cetirizine 10 mg, montelukast 10 mg, fluticasone 2 sprays each nostril as well as as needed azelastine eye drop.    He is not sure if he has been around any sick exposures.    Review of systems: Review of Systems  Constitutional: Positive for chills, diaphoresis and malaise/fatigue.  HENT: Positive for congestion, sinus pain and sore throat. Negative for ear pain and nosebleeds.   Eyes: Negative for discharge and redness.  Respiratory: Positive for cough, sputum production, shortness of breath and wheezing.   Cardiovascular: Positive for chest pain. Negative for palpitations.  Gastrointestinal: Negative for abdominal pain, nausea and vomiting.  Skin: Negative for itching and rash.    All other systems negative unless noted above in HPI  Past medical/social/surgical/family history have been reviewed and are unchanged unless specifically indicated below.  No changes  Medication List: Allergies as of 06/11/2016      Reactions   Grapefruit Extract Other (See Comments)   Blisters all over body   Lemon Oil Other (See Comments)   Blisters all over body   Lime, Sulfurated Other (See  Comments)   Blisters all over body   Orange (diagnostic) Other (See Comments)   Blisters all over body   Penicillins    Topiramate Other (See Comments)      Medication List       Accurate as of 06/11/16  3:58 PM. Always use your most recent med list.          acetaminophen-codeine 300-30 MG tablet Commonly known as:  TYLENOL #3   albuterol 108 (90 Base) MCG/ACT inhaler Commonly known as:  PROAIR HFA Inhale 2 puffs into the lungs every 4 (four) hours as needed for wheezing or shortness of breath.   azithromycin 250 MG tablet Commonly known as:  ZITHROMAX 2 tablets on day one then one tablet on days 2-5.   CANDESARTAN CILEXETIL-HCTZ PO Take by mouth.   cetirizine 10 MG tablet Commonly known as:  ZYRTEC TAKE 1 TABLET BY MOUTH EVERY DAY FOR RUNNY NOSE OR ITCHING   chlorthalidone 25 MG tablet Commonly known as:  HYGROTON TAKE 1 TABLET BY MOUTH EVERY DAY   citalopram 40 MG tablet Commonly known as:  CELEXA Take 40 mg by mouth daily.   clotrimazole-betamethasone cream Commonly known as:  LOTRISONE Apply bid to affected areas prn   DYMISTA 137-50 MCG/ACT Susp Generic drug:  Azelastine-Fluticasone Place 2 sprays into the nose at bedtime. Reported on 07/03/2015   EPINEPHrine 0.3 mg/0.3 mL Soaj injection Commonly known as:  EPI-PEN Use as directed for severe allergic reaction   fluticasone 50 MCG/ACT nasal spray Commonly known  as:  FLONASE Place 2 sprays into both nostrils daily.   metFORMIN 500 MG tablet Commonly known as:  GLUCOPHAGE TAKE 1 TABLET BY MOUTH EVERY EVENING FORDIABETES   montelukast 10 MG tablet Commonly known as:  SINGULAIR Take 1 tablet (10 mg total) by mouth at bedtime.   mupirocin ointment 2 % Commonly known as:  BACTROBAN Apply small dab twice daily as instructed for 10 days in both sides of the nose   naproxen sodium 220 MG tablet Commonly known as:  ANAPROX Take 220 mg by mouth.   neomycin-polymyxin-hydrocortisone 3.5-10000-0.5  cream Commonly known as:  CORTISPORIN Apply topically 2 (two) times daily.   NONFORMULARY OR COMPOUNDED ITEM   ONE TOUCH ULTRA TEST test strip Generic drug:  glucose blood -TEST ONCE DAILY OR AS PHYSICIAN INSTRUCTED (E119 NIDDM)   ONETOUCH DELICA LANCETS 33G Misc CHECK BLOOD SUGAR ONCE PER DAY OR AS DIRECTED (DX:E11.9)   OPTIVAR 0.05 % ophthalmic solution Generic drug:  azelastine Place 1 drop into both eyes 2 times daily.   simvastatin 20 MG tablet Commonly known as:  ZOCOR Take 20 mg by mouth daily.   TESSALON PERLES PO Take by mouth.   Testosterone 20.25 MG/ACT (1.62%) Gel 2 pumps daily apply to skin as directed   topiramate 50 MG tablet Commonly known as:  TOPAMAX Reported on 07/03/2015   traZODone 50 MG tablet Commonly known as:  DESYREL Take 50 mg by mouth at bedtime.       Known medication allergies: Allergies  Allergen Reactions  . Grapefruit Extract Other (See Comments)    Blisters all over body  . Lemon Oil Other (See Comments)    Blisters all over body  . Lime, Sulfurated Other (See Comments)    Blisters all over body  . Orange (Diagnostic) Other (See Comments)    Blisters all over body  . Penicillins   . Topiramate Other (See Comments)     Physical examination: Blood pressure 110/72, pulse 77, temperature 97.8 F (36.6 C), temperature source Oral, resp. rate 16, height 5\' 6"  (1.676 m), SpO2 97 %.  General: Alert, interactive, in no acute distress. HEENT:  Ears normal without any erythema or drainage, turbinates mildly edematous with crusty discharge, post-pharynx non erythematous. Neck: Supple without lymphadenopathy. Lungs: Clear to auscultation without wheezing, rhonchi or rales. {no increased work of breathing. CV: Normal S1, S2 without murmurs. Abdomen: Nondistended, nontender. Skin: Warm and dry, without lesions or rashes. Extremities:  No clubbing, cyanosis or edema. Neuro:   Grossly intact.  Diagnositics/Labs: Spirometry: FEV1:  2.73L  83%, FVC: 3.46L  81%, ratio consistent with Nonobstructive pattern  Assessment and plan:   Mild persistent asthma with exacerbation with URI    - exacerbation likely triggered from upper respiratory tract infection. At this time with 2-3 days of symptoms it is most likely viral in nature. However he does complain of chills and sweats, frontal pressure and increased nasal drainage.  We'll treat initially for asthma exacerbation if he does not have improvement in his symptoms over the next 2 days and advised that he fill his Z-Pak and take for acute rhinosinusitis.      - take prednisone 20mg  twice a day x 3 days, then 20mg  x 1 day, then 10mg  x 1 day.    - If you are not feeling better by Thursday then start Azthromycin (z-pak) 500mg  x day 1, then 250mg  x day 2-5    - take your albuterol (proair) 2 puffs every 4 hours for the next  2-3 days then resume as needed use.      - continue your current regimen of Singulair 10mg  daily, Cetirizine 10mg  daily, fluticasone nasal spray 2 sprays each nostril.        Start using your nasal saline rinses followed by your Fluticasone    - let us know if your symptoms worsen/not improving over the next several days.    Follow-up with Dr. Beaulah Dinning in 2-3 months or sooner if needed  I appreciate the opportunity to take part in Brogen's care. Please do not hesitate to contact me with questions.  Sincerely,   Margo Aye, MD Allergy/Immunology [facility]

## 2016-06-11 NOTE — Addendum Note (Signed)
Addended by: Maryjean MornFREEMAN, LOGAN D on: 06/11/2016 04:24 PM   Modules accepted: Orders

## 2016-06-19 ENCOUNTER — Ambulatory Visit (INDEPENDENT_AMBULATORY_CARE_PROVIDER_SITE_OTHER): Payer: Medicare Other

## 2016-06-19 DIAGNOSIS — J309 Allergic rhinitis, unspecified: Secondary | ICD-10-CM | POA: Diagnosis not present

## 2016-06-25 ENCOUNTER — Ambulatory Visit (INDEPENDENT_AMBULATORY_CARE_PROVIDER_SITE_OTHER): Payer: Medicare Other

## 2016-06-25 DIAGNOSIS — J309 Allergic rhinitis, unspecified: Secondary | ICD-10-CM

## 2016-07-04 ENCOUNTER — Ambulatory Visit (INDEPENDENT_AMBULATORY_CARE_PROVIDER_SITE_OTHER): Payer: Medicare Other

## 2016-07-04 DIAGNOSIS — J309 Allergic rhinitis, unspecified: Secondary | ICD-10-CM

## 2016-07-11 ENCOUNTER — Ambulatory Visit (INDEPENDENT_AMBULATORY_CARE_PROVIDER_SITE_OTHER): Payer: Medicare Other

## 2016-07-11 DIAGNOSIS — J309 Allergic rhinitis, unspecified: Secondary | ICD-10-CM | POA: Diagnosis not present

## 2016-07-17 ENCOUNTER — Ambulatory Visit (INDEPENDENT_AMBULATORY_CARE_PROVIDER_SITE_OTHER): Payer: Medicare Other | Admitting: *Deleted

## 2016-07-17 DIAGNOSIS — J309 Allergic rhinitis, unspecified: Secondary | ICD-10-CM | POA: Diagnosis not present

## 2016-07-25 ENCOUNTER — Ambulatory Visit (INDEPENDENT_AMBULATORY_CARE_PROVIDER_SITE_OTHER): Payer: Medicare Other

## 2016-07-25 DIAGNOSIS — J309 Allergic rhinitis, unspecified: Secondary | ICD-10-CM | POA: Diagnosis not present

## 2016-08-01 ENCOUNTER — Ambulatory Visit (INDEPENDENT_AMBULATORY_CARE_PROVIDER_SITE_OTHER): Payer: Medicare Other

## 2016-08-01 DIAGNOSIS — J309 Allergic rhinitis, unspecified: Secondary | ICD-10-CM | POA: Diagnosis not present

## 2016-08-07 ENCOUNTER — Ambulatory Visit (INDEPENDENT_AMBULATORY_CARE_PROVIDER_SITE_OTHER): Payer: Medicare Other | Admitting: *Deleted

## 2016-08-07 DIAGNOSIS — J309 Allergic rhinitis, unspecified: Secondary | ICD-10-CM

## 2016-08-14 ENCOUNTER — Ambulatory Visit (INDEPENDENT_AMBULATORY_CARE_PROVIDER_SITE_OTHER): Payer: Medicare Other

## 2016-08-14 DIAGNOSIS — J309 Allergic rhinitis, unspecified: Secondary | ICD-10-CM | POA: Diagnosis not present

## 2016-08-21 ENCOUNTER — Ambulatory Visit (INDEPENDENT_AMBULATORY_CARE_PROVIDER_SITE_OTHER): Payer: Medicare Other

## 2016-08-21 DIAGNOSIS — J309 Allergic rhinitis, unspecified: Secondary | ICD-10-CM

## 2016-08-28 ENCOUNTER — Ambulatory Visit (INDEPENDENT_AMBULATORY_CARE_PROVIDER_SITE_OTHER): Payer: Medicare Other

## 2016-08-28 DIAGNOSIS — J309 Allergic rhinitis, unspecified: Secondary | ICD-10-CM | POA: Diagnosis not present

## 2016-08-29 ENCOUNTER — Encounter: Payer: Self-pay | Admitting: *Deleted

## 2016-09-01 DIAGNOSIS — J3089 Other allergic rhinitis: Secondary | ICD-10-CM | POA: Diagnosis not present

## 2016-09-02 DIAGNOSIS — J301 Allergic rhinitis due to pollen: Secondary | ICD-10-CM | POA: Diagnosis not present

## 2016-09-03 ENCOUNTER — Ambulatory Visit (INDEPENDENT_AMBULATORY_CARE_PROVIDER_SITE_OTHER): Payer: Medicare Other | Admitting: *Deleted

## 2016-09-03 DIAGNOSIS — J309 Allergic rhinitis, unspecified: Secondary | ICD-10-CM | POA: Diagnosis not present

## 2016-09-11 ENCOUNTER — Encounter: Payer: Self-pay | Admitting: Allergy and Immunology

## 2016-09-11 ENCOUNTER — Ambulatory Visit (INDEPENDENT_AMBULATORY_CARE_PROVIDER_SITE_OTHER): Payer: Medicare Other | Admitting: Allergy and Immunology

## 2016-09-11 VITALS — BP 140/98 | HR 86 | Temp 97.8°F | Resp 16

## 2016-09-11 DIAGNOSIS — T7800XD Anaphylactic reaction due to unspecified food, subsequent encounter: Secondary | ICD-10-CM | POA: Diagnosis not present

## 2016-09-11 DIAGNOSIS — J4530 Mild persistent asthma, uncomplicated: Secondary | ICD-10-CM

## 2016-09-11 DIAGNOSIS — I10 Essential (primary) hypertension: Secondary | ICD-10-CM | POA: Diagnosis not present

## 2016-09-11 DIAGNOSIS — J309 Allergic rhinitis, unspecified: Secondary | ICD-10-CM | POA: Diagnosis not present

## 2016-09-11 NOTE — Assessment & Plan Note (Signed)
Currently well controlled.  Continue montelukast 10 mg daily bedtime and albuterol every 4-6 hours as needed.  During upper respiratory tract infections or asthma flares, use albuterol every 4-6 hours on a scheduled basis while awake for 2 or 3 days.

## 2016-09-11 NOTE — Progress Notes (Signed)
Follow-up Note  RE: Luis Richardson MRN: [id number] DOB: November 19, 1960 Date of Office Visit: 09/11/2016  Primary care provider: Oletha Blend, MD Referring provider: Oletha Blend, MD  History of present illness: Luis Richardson is a 56 y.o. male with persistent asthma and allergic rhinitis presenting today for follow up.  He was last seen in this clinic in early January 2018 by Dr. Delorse Lek for an asthma exacerbation and sinus infection.  His upper and lower rest for symptoms improved with a course of prednisone and antibiotics.  Over the past couple months he has done quite well, has not required albuterol rescue, and does not experience nocturnal awakenings due to lower respiratory symptoms.  He has no nasal or sinus symptom complaints today.  He currently takes montelukast 10 mg daily bedtime, albuterol HFA as needed, and fluticasone nasal spray as needed.  He carefully avoids oranges, lemons, and grapes and has access to epinephrine autoinjectors in case of accidental ingestion.   Assessment and plan: Mild persistent asthma Currently well controlled.  Continue montelukast 10 mg daily bedtime and albuterol every 4-6 hours as needed.  During upper respiratory tract infections or asthma flares, use albuterol every 4-6 hours on a scheduled basis while awake for 2 or 3 days.  Allergic rhinitis Stable.  Continue appropriate allergen avoidance measures, aeroallergen immunotherapy as prescribed and as tolerated, montelukast daily, and fluticasone nasal spray as needed.  Allergy with anaphylaxis due to food  Continue careful avoidance of oranges, lemons, and grapes and have access to epinephrine autoinjector 2 pack in case of accidental ingestion.  Food allergy action plan is in place.  Hypertension Blood pressure was elevated on 2 separate readings today despite compliance with antihypertensives.  He has been informed of his elevated blood pressure readings and has been encouraged to  follow up with his primary care physician regarding this issue.  He has verbalized understanding and agrees to do so.  For now, continue antihypertensives as prescribed.   Diagnostics: Spirometry:  Normal with an FEV1 of 84% predicted.  Please see scanned spirometry results for details.    Physical examination: Blood pressure (!) 140/98, pulse 86, temperature 97.8 F (36.6 C), temperature source Oral, resp. rate 16, SpO2 95 %.  General: Alert, interactive, in no acute distress. HEENT: TMs pearly gray, turbinates mildly edematous without discharge, post-pharynx mildly erythematous. Neck: Supple without lymphadenopathy. Lungs: Clear to auscultation without wheezing, rhonchi or rales. CV: Normal S1, S2 without murmurs. Skin: Warm and dry, without lesions or rashes.  The following portions of the patient's history were reviewed and updated as appropriate: allergies, current medications, past family history, past medical history, past social history, past surgical history and problem list.  Allergies as of 09/11/2016      Reactions   Grapefruit Extract Other (See Comments)   Blisters all over body   Lemon Oil Other (See Comments)   Blisters all over body   Lime, Sulfurated Other (See Comments)   Blisters all over body   Orange (diagnostic) Other (See Comments)   Blisters all over body   Penicillins    Topiramate Other (See Comments)      Medication List       Accurate as of 09/11/16  5:43 PM. Always use your most recent med list.          acetaminophen-codeine 300-30 MG tablet Commonly known as:  TYLENOL #3   albuterol 108 (90 Base) MCG/ACT inhaler Commonly known as:  PROAIR HFA Inhale 2 puffs into the  lungs every 6 (six) hours as needed for wheezing or shortness of breath.   azithromycin 250 MG tablet Commonly known as:  ZITHROMAX 2 tablets on day one then one tablet on days 2-5.   CANDESARTAN CILEXETIL-HCTZ PO Take by mouth.   cetirizine 10 MG tablet Commonly known  as:  ZYRTEC TAKE 1 TABLET BY MOUTH EVERY DAY FOR RUNNY NOSE OR ITCHING   chlorthalidone 25 MG tablet Commonly known as:  HYGROTON TAKE 1 TABLET BY MOUTH EVERY DAY   citalopram 40 MG tablet Commonly known as:  CELEXA Take 40 mg by mouth daily.   clotrimazole-betamethasone cream Commonly known as:  LOTRISONE Apply bid to affected areas prn   DYMISTA 137-50 MCG/ACT Susp Generic drug:  Azelastine-Fluticasone Place 2 sprays into the nose at bedtime. Reported on 07/03/2015   EPINEPHrine 0.3 mg/0.3 mL Soaj injection Commonly known as:  EPI-PEN Use as directed for severe allergic reaction   fluticasone 50 MCG/ACT nasal spray Commonly known as:  FLONASE Place 2 sprays into both nostrils daily.   metFORMIN 500 MG tablet Commonly known as:  GLUCOPHAGE TAKE 1 TABLET BY MOUTH EVERY EVENING FORDIABETES   montelukast 10 MG tablet Commonly known as:  SINGULAIR Take 1 tablet (10 mg total) by mouth at bedtime.   mupirocin ointment 2 % Commonly known as:  BACTROBAN Apply small dab twice daily as instructed for 10 days in both sides of the nose   naproxen sodium 220 MG tablet Commonly known as:  ANAPROX Take 220 mg by mouth.   neomycin-polymyxin-hydrocortisone 3.5-10000-0.5 cream Commonly known as:  CORTISPORIN Apply topically 2 (two) times daily.   NONFORMULARY OR COMPOUNDED ITEM   ONE TOUCH ULTRA TEST test strip Generic drug:  glucose blood -TEST ONCE DAILY OR AS PHYSICIAN INSTRUCTED (E119 NIDDM)   ONETOUCH DELICA LANCETS 33G Misc CHECK BLOOD SUGAR ONCE PER DAY OR AS DIRECTED (DX:E11.9)   OPTIVAR 0.05 % ophthalmic solution Generic drug:  azelastine Place 1 drop into both eyes 2 times daily.   simvastatin 20 MG tablet Commonly known as:  ZOCOR Take 20 mg by mouth daily.   TESSALON PERLES PO Take by mouth.   Testosterone 20.25 MG/ACT (1.62%) Gel 2 pumps daily apply to skin as directed   testosterone cypionate 200 MG/ML injection Commonly known as:  DEPOTESTOSTERONE  CYPIONATE Inject 200 mg into the muscle.   topiramate 50 MG tablet Commonly known as:  TOPAMAX Reported on 07/03/2015   traZODone 50 MG tablet Commonly known as:  DESYREL Take 50 mg by mouth at bedtime.       Allergies  Allergen Reactions  . Grapefruit Extract Other (See Comments)    Blisters all over body  . Lemon Oil Other (See Comments)    Blisters all over body  . Lime, Sulfurated Other (See Comments)    Blisters all over body  . Orange (Diagnostic) Other (See Comments)    Blisters all over body  . Penicillins   . Topiramate Other (See Comments)   Review of systems: Review of systems negative except as noted in HPI / PMHx or noted below: Constitutional: Negative.  HENT: Negative.   Eyes: Negative.  Respiratory: Negative.   Cardiovascular: Negative.  Gastrointestinal: Negative.  Genitourinary: Negative.  Musculoskeletal: Negative.  Neurological: Negative.  Endo/Heme/Allergies: Negative.  Cutaneous: Negative.  Past Medical History:  Diagnosis Date  . Asthma   . Urticaria     Family History  Problem Relation Age of Onset  . Angioedema Neg Hx   . Allergic rhinitis Neg  Hx   . Asthma Neg Hx   . Eczema Neg Hx   . Immunodeficiency Neg Hx   . Urticaria Neg Hx     Social History   Social History  . Marital status: Married    Spouse name: N/A  . Number of children: N/A  . Years of education: N/A   Occupational History  . Not on file.   Social History Main Topics  . Smoking status: Never Smoker  . Smokeless tobacco: Current User    Types: Chew  . Alcohol use 0.0 oz/week  . Drug use: No  . Sexual activity: Not on file   Other Topics Concern  . Not on file   Social History Narrative  . No narrative on file    I appreciate the opportunity to take part in Jahmeir's care. Please do not hesitate to contact me with questions.  Sincerely,   R. Jorene Guest, MD

## 2016-09-11 NOTE — Patient Instructions (Addendum)
Mild persistent asthma Currently well controlled.  Continue montelukast 10 mg daily bedtime and albuterol every 4-6 hours as needed.  During upper respiratory tract infections or asthma flares, use albuterol every 4-6 hours on a scheduled basis while awake for 2 or 3 days.  Allergic rhinitis Stable.  Continue appropriate allergen avoidance measures, aeroallergen immunotherapy as prescribed and as tolerated, montelukast daily, and fluticasone nasal spray as needed.  Allergy with anaphylaxis due to food  Continue careful avoidance of oranges, lemons, and grapes and have access to epinephrine autoinjector 2 pack in case of accidental ingestion.  Food allergy action plan is in place.  Hypertension Blood pressure was elevated on 2 separate readings today despite compliance with antihypertensives.  He has been informed of his elevated blood pressure readings and has been encouraged to follow up with his primary care physician regarding this issue.  He has verbalized understanding and agrees to do so.  For now, continue antihypertensives as prescribed.   Return in about 5 months (around 02/11/2017), or if symptoms worsen or fail to improve.

## 2016-09-11 NOTE — Assessment & Plan Note (Addendum)
Blood pressure was elevated on 2 separate readings today despite compliance with antihypertensives.  He has been informed of his elevated blood pressure readings and has been encouraged to follow up with his primary care physician regarding this issue.  He has verbalized understanding and agrees to do so.  For now, continue antihypertensives as prescribed.

## 2016-09-11 NOTE — Assessment & Plan Note (Signed)
   Continue careful avoidance of oranges, lemons, and grapes and have access to epinephrine autoinjector 2 pack in case of accidental ingestion.  Food allergy action plan is in place.

## 2016-09-11 NOTE — Assessment & Plan Note (Signed)
Stable.  Continue appropriate allergen avoidance measures, aeroallergen immunotherapy as prescribed and as tolerated, montelukast daily, and fluticasone nasal spray as needed.

## 2016-09-18 ENCOUNTER — Ambulatory Visit (INDEPENDENT_AMBULATORY_CARE_PROVIDER_SITE_OTHER): Payer: Medicare Other

## 2016-09-18 DIAGNOSIS — J309 Allergic rhinitis, unspecified: Secondary | ICD-10-CM | POA: Diagnosis not present

## 2016-09-25 ENCOUNTER — Ambulatory Visit (INDEPENDENT_AMBULATORY_CARE_PROVIDER_SITE_OTHER): Payer: Medicare Other

## 2016-09-25 DIAGNOSIS — J309 Allergic rhinitis, unspecified: Secondary | ICD-10-CM

## 2016-10-02 ENCOUNTER — Ambulatory Visit (INDEPENDENT_AMBULATORY_CARE_PROVIDER_SITE_OTHER): Payer: Medicare Other | Admitting: *Deleted

## 2016-10-02 DIAGNOSIS — J309 Allergic rhinitis, unspecified: Secondary | ICD-10-CM

## 2016-10-09 ENCOUNTER — Ambulatory Visit (INDEPENDENT_AMBULATORY_CARE_PROVIDER_SITE_OTHER): Payer: Medicare Other

## 2016-10-09 DIAGNOSIS — J309 Allergic rhinitis, unspecified: Secondary | ICD-10-CM

## 2016-10-16 ENCOUNTER — Ambulatory Visit (INDEPENDENT_AMBULATORY_CARE_PROVIDER_SITE_OTHER): Payer: Medicare Other | Admitting: *Deleted

## 2016-10-16 DIAGNOSIS — J309 Allergic rhinitis, unspecified: Secondary | ICD-10-CM

## 2016-10-30 ENCOUNTER — Ambulatory Visit (INDEPENDENT_AMBULATORY_CARE_PROVIDER_SITE_OTHER): Payer: Medicare Other

## 2016-10-30 DIAGNOSIS — J309 Allergic rhinitis, unspecified: Secondary | ICD-10-CM | POA: Diagnosis not present

## 2016-11-01 ENCOUNTER — Encounter: Payer: Self-pay | Admitting: *Deleted

## 2016-11-01 NOTE — Progress Notes (Signed)
Maintenance vials made. Exp: 11/08/17. hc 

## 2016-11-08 ENCOUNTER — Other Ambulatory Visit: Payer: Self-pay

## 2016-11-08 MED ORDER — FLUTICASONE PROPIONATE 50 MCG/ACT NA SUSP: 2.0000 | Freq: Every day | NASAL | 5 refills | Status: DC

## 2016-11-08 NOTE — Telephone Encounter (Signed)
RF for fluticasone x 5 at [facility]

## 2016-11-11 DIAGNOSIS — J3089 Other allergic rhinitis: Secondary | ICD-10-CM | POA: Diagnosis not present

## 2016-11-12 DIAGNOSIS — J3089 Other allergic rhinitis: Secondary | ICD-10-CM | POA: Diagnosis not present

## 2016-11-13 ENCOUNTER — Ambulatory Visit (INDEPENDENT_AMBULATORY_CARE_PROVIDER_SITE_OTHER): Payer: Medicare Other

## 2016-11-13 ENCOUNTER — Other Ambulatory Visit: Payer: Self-pay | Admitting: Allergy

## 2016-11-13 DIAGNOSIS — J309 Allergic rhinitis, unspecified: Secondary | ICD-10-CM | POA: Diagnosis not present

## 2016-11-13 MED ORDER — MONTELUKAST SODIUM 10 MG PO TABS: 10.0000 mg | ORAL_TABLET | Freq: Every day | ORAL | 5 refills | Status: DC

## 2016-11-27 ENCOUNTER — Ambulatory Visit (INDEPENDENT_AMBULATORY_CARE_PROVIDER_SITE_OTHER): Payer: Medicare Other

## 2016-11-27 DIAGNOSIS — J309 Allergic rhinitis, unspecified: Secondary | ICD-10-CM

## 2016-12-12 ENCOUNTER — Ambulatory Visit (INDEPENDENT_AMBULATORY_CARE_PROVIDER_SITE_OTHER): Payer: Medicare Other

## 2016-12-12 DIAGNOSIS — J309 Allergic rhinitis, unspecified: Secondary | ICD-10-CM

## 2016-12-25 ENCOUNTER — Ambulatory Visit (INDEPENDENT_AMBULATORY_CARE_PROVIDER_SITE_OTHER): Payer: Medicare Other | Admitting: *Deleted

## 2016-12-25 DIAGNOSIS — J309 Allergic rhinitis, unspecified: Secondary | ICD-10-CM

## 2017-01-01 ENCOUNTER — Ambulatory Visit (INDEPENDENT_AMBULATORY_CARE_PROVIDER_SITE_OTHER): Payer: Medicare Other

## 2017-01-01 DIAGNOSIS — J309 Allergic rhinitis, unspecified: Secondary | ICD-10-CM | POA: Diagnosis not present

## 2017-01-08 ENCOUNTER — Ambulatory Visit (INDEPENDENT_AMBULATORY_CARE_PROVIDER_SITE_OTHER): Payer: Medicare Other | Admitting: *Deleted

## 2017-01-08 DIAGNOSIS — J309 Allergic rhinitis, unspecified: Secondary | ICD-10-CM

## 2017-01-15 ENCOUNTER — Ambulatory Visit (INDEPENDENT_AMBULATORY_CARE_PROVIDER_SITE_OTHER): Payer: Medicare Other

## 2017-01-15 DIAGNOSIS — J309 Allergic rhinitis, unspecified: Secondary | ICD-10-CM | POA: Diagnosis not present

## 2017-01-22 ENCOUNTER — Ambulatory Visit (INDEPENDENT_AMBULATORY_CARE_PROVIDER_SITE_OTHER): Payer: Medicare Other

## 2017-01-22 DIAGNOSIS — J309 Allergic rhinitis, unspecified: Secondary | ICD-10-CM

## 2017-02-06 ENCOUNTER — Ambulatory Visit (INDEPENDENT_AMBULATORY_CARE_PROVIDER_SITE_OTHER): Payer: Medicare Other

## 2017-02-06 DIAGNOSIS — J309 Allergic rhinitis, unspecified: Secondary | ICD-10-CM

## 2017-02-19 ENCOUNTER — Ambulatory Visit (INDEPENDENT_AMBULATORY_CARE_PROVIDER_SITE_OTHER): Payer: Medicare Other | Admitting: *Deleted

## 2017-02-19 DIAGNOSIS — J309 Allergic rhinitis, unspecified: Secondary | ICD-10-CM

## 2017-03-04 DIAGNOSIS — J301 Allergic rhinitis due to pollen: Secondary | ICD-10-CM | POA: Diagnosis not present

## 2017-03-05 DIAGNOSIS — J3089 Other allergic rhinitis: Secondary | ICD-10-CM | POA: Diagnosis not present

## 2017-03-06 ENCOUNTER — Ambulatory Visit (INDEPENDENT_AMBULATORY_CARE_PROVIDER_SITE_OTHER): Payer: Medicare Other

## 2017-03-06 DIAGNOSIS — J309 Allergic rhinitis, unspecified: Secondary | ICD-10-CM | POA: Diagnosis not present

## 2017-03-19 ENCOUNTER — Ambulatory Visit (INDEPENDENT_AMBULATORY_CARE_PROVIDER_SITE_OTHER): Payer: Medicare Other | Admitting: *Deleted

## 2017-03-19 DIAGNOSIS — J309 Allergic rhinitis, unspecified: Secondary | ICD-10-CM

## 2017-04-02 ENCOUNTER — Telehealth: Payer: Self-pay | Admitting: *Deleted

## 2017-04-02 ENCOUNTER — Ambulatory Visit (INDEPENDENT_AMBULATORY_CARE_PROVIDER_SITE_OTHER): Payer: Medicare Other

## 2017-04-02 DIAGNOSIS — J309 Allergic rhinitis, unspecified: Secondary | ICD-10-CM | POA: Diagnosis not present

## 2017-04-02 NOTE — Telephone Encounter (Signed)
Patient states that he received a call stating that he has been turned into collections. Patient says he has never received a bill but will start paying $20 a month next week.

## 2017-04-02 NOTE — Telephone Encounter (Signed)
Left message with interpreter to call me back - kt

## 2017-04-11 NOTE — Telephone Encounter (Signed)
Pt will pay $20/mo beginning today - also asked him to pay on his bad debt balance when he can - kt

## 2017-04-23 ENCOUNTER — Ambulatory Visit (INDEPENDENT_AMBULATORY_CARE_PROVIDER_SITE_OTHER): Payer: Medicare Other

## 2017-04-23 DIAGNOSIS — J309 Allergic rhinitis, unspecified: Secondary | ICD-10-CM

## 2017-05-07 ENCOUNTER — Ambulatory Visit (INDEPENDENT_AMBULATORY_CARE_PROVIDER_SITE_OTHER): Payer: Medicare Other | Admitting: *Deleted

## 2017-05-07 DIAGNOSIS — J309 Allergic rhinitis, unspecified: Secondary | ICD-10-CM

## 2017-05-14 ENCOUNTER — Ambulatory Visit (INDEPENDENT_AMBULATORY_CARE_PROVIDER_SITE_OTHER): Payer: Medicare Other

## 2017-05-14 DIAGNOSIS — J309 Allergic rhinitis, unspecified: Secondary | ICD-10-CM

## 2017-05-21 ENCOUNTER — Ambulatory Visit (INDEPENDENT_AMBULATORY_CARE_PROVIDER_SITE_OTHER): Payer: Medicare Other | Admitting: *Deleted

## 2017-05-21 DIAGNOSIS — J309 Allergic rhinitis, unspecified: Secondary | ICD-10-CM

## 2017-05-28 ENCOUNTER — Ambulatory Visit (INDEPENDENT_AMBULATORY_CARE_PROVIDER_SITE_OTHER): Payer: Medicare Other

## 2017-05-28 DIAGNOSIS — J309 Allergic rhinitis, unspecified: Secondary | ICD-10-CM | POA: Diagnosis not present

## 2017-06-04 ENCOUNTER — Ambulatory Visit (INDEPENDENT_AMBULATORY_CARE_PROVIDER_SITE_OTHER): Payer: Medicare Other

## 2017-06-04 DIAGNOSIS — J309 Allergic rhinitis, unspecified: Secondary | ICD-10-CM

## 2017-06-18 ENCOUNTER — Ambulatory Visit (INDEPENDENT_AMBULATORY_CARE_PROVIDER_SITE_OTHER): Payer: Medicare Other | Admitting: *Deleted

## 2017-06-18 DIAGNOSIS — J309 Allergic rhinitis, unspecified: Secondary | ICD-10-CM

## 2017-06-18 MED ORDER — FLUTICASONE PROPIONATE 50 MCG/ACT NA SUSP: 2.0000 | Freq: Every day | NASAL | 0 refills | Status: DC

## 2017-06-18 MED ORDER — MONTELUKAST SODIUM 10 MG PO TABS: 10.0000 mg | ORAL_TABLET | Freq: Every day | ORAL | 0 refills | Status: DC

## 2017-06-18 MED ORDER — ALBUTEROL SULFATE HFA 108 (90 BASE) MCG/ACT IN AERS: 2.0000 | INHALATION_SPRAY | Freq: Four times a day (QID) | RESPIRATORY_TRACT | 0 refills | Status: DC | PRN

## 2017-06-30 ENCOUNTER — Ambulatory Visit (INDEPENDENT_AMBULATORY_CARE_PROVIDER_SITE_OTHER): Payer: Medicare Other | Admitting: Allergy and Immunology

## 2017-06-30 ENCOUNTER — Encounter: Payer: Self-pay | Admitting: Allergy and Immunology

## 2017-06-30 VITALS — BP 128/78 | HR 71 | Resp 18 | Ht 66.75 in | Wt 287.8 lb

## 2017-06-30 DIAGNOSIS — T7800XD Anaphylactic reaction due to unspecified food, subsequent encounter: Secondary | ICD-10-CM | POA: Diagnosis not present

## 2017-06-30 DIAGNOSIS — J3089 Other allergic rhinitis: Secondary | ICD-10-CM | POA: Diagnosis not present

## 2017-06-30 DIAGNOSIS — J4540 Moderate persistent asthma, uncomplicated: Secondary | ICD-10-CM

## 2017-06-30 DIAGNOSIS — H9190 Unspecified hearing loss, unspecified ear: Secondary | ICD-10-CM | POA: Insufficient documentation

## 2017-06-30 DIAGNOSIS — H9193 Unspecified hearing loss, bilateral: Secondary | ICD-10-CM | POA: Diagnosis not present

## 2017-06-30 MED ORDER — MONTELUKAST SODIUM 10 MG PO TABS: 10.0000 mg | ORAL_TABLET | Freq: Every day | ORAL | 0 refills | Status: DC

## 2017-06-30 MED ORDER — FLUTICASONE PROPIONATE 50 MCG/ACT NA SUSP: 2.0000 | Freq: Every day | NASAL | 0 refills | Status: DC

## 2017-06-30 MED ORDER — FLUTICASONE PROPIONATE HFA 110 MCG/ACT IN AERO: 2.0000 | INHALATION_SPRAY | Freq: Two times a day (BID) | RESPIRATORY_TRACT | 5 refills | Status: DC

## 2017-06-30 MED ORDER — ALBUTEROL SULFATE HFA 108 (90 BASE) MCG/ACT IN AERS: 2.0000 | INHALATION_SPRAY | Freq: Four times a day (QID) | RESPIRATORY_TRACT | 0 refills | Status: DC | PRN

## 2017-06-30 NOTE — Progress Notes (Signed)
Follow-up Note  RE: Luis Richardson MRN: [id number] DOB: [birth year] Date of Office Visit: 06/30/2017  Primary care provider: Oletha Blend, MD Referring provider: Oletha Blend, MD  History of present illness: Luis Richardson is a 57 y.o. male with persistent asthma and allergic rhinitis presenting today for follow-up.  He was last seen in this clinic in April 2018.  He is accompanied today by a sign language interpreter.  He reports that despite compliance with montelukast 10 mg daily bedtime, he has been experiencing asthma symptoms 3-5 times per week, particularly with exertion and in cold weather.  He denies nocturnal awakenings due to lower respiratory symptoms.  His nasal symptoms are well controlled and he is tolerating aeroallergen without problems or complications.  He carefully avoids oranges, lemons, and grapes and has access to epinephrine autoinjectors in case of accidental ingestion followed by systemic symptoms. He complains that when he puts his hearing aid into his left ear canal that it feels like there is pressure in the ear and he "cannot hear a thing."  He has been followed by an otolaryngologist in [facility] colonic, however having moved he requests referral to an otolaryngologist in [place].   Assessment and plan: Moderate persistent asthma Currently with suboptimal control.  In addition, todays spirometry results, assessed while asymptomatic, suggest under-perception of bronchoconstriction.  A prescription has been provided for Flovent (fluticasone) 110 g,  2 inhalations twice a day. To maximize pulmonary deposition, a spacer has been provided along with instructions for its proper administration with an HFA inhaler.  Continue montelukast 10 mg daily bedtime and albuterol HFA, 1-2 inhalations every 6 hours if needed.  The patient has been asked to contact me if his symptoms persist or progress. Otherwise, he may return for follow up in 4  months.  Allergic rhinitis Stable.  Continue appropriate allergen avoidance measures, aeroallergen immunotherapy as prescribed and as tolerated, montelukast daily, and fluticasone nasal spray as needed.  Food allergy  Continue meticulous avoidance of oranges, lemons, and grapes and have access to epinephrine autoinjector 2 pack in case of accidental ingestion.  Food allergy action plan is in place.  Problems with hearing The patient has requested a referral to otolaryngology here in [place].  A referral has been made for evaluation and treatment by Dr. Suszanne Conners.   Meds ordered this encounter  Medications  . fluticasone (FLOVENT HFA) 110 MCG/ACT inhaler    Sig: Inhale 2 puffs into the lungs 2 (two) times daily.    Dispense:  1 Inhaler    Refill:  5  . albuterol (PROAIR HFA) 108 (90 Base) MCG/ACT inhaler    Sig: Inhale 2 puffs into the lungs every 6 (six) hours as needed for wheezing or shortness of breath.    Dispense:  1 Inhaler    Refill:  0  . montelukast (SINGULAIR) 10 MG tablet    Sig: Take 1 tablet (10 mg total) by mouth at bedtime.    Dispense:  30 tablet    Refill:  0    For cough or wheeze  . fluticasone (FLONASE) 50 MCG/ACT nasal spray    Sig: Place 2 sprays into both nostrils daily.    Dispense:  16 g    Refill:  0    For stuffy nose or drainage. Please dispense 90 day supply.    Diagnostics: Spirometry reveals an FVC of 3.50 L and an FEV1 of 2.57 L (79% predicted) with significant (330 mL, 13%) postbronchodilator improvement.  This study  was performed while the patient was asymptomatic.  Please see scanned spirometry results for details.    Physical examination: Blood pressure 128/78, pulse 71, resp. rate 18, height 5' 6.75" (1.695 m), weight 287 lb 12.8 oz (130.5 kg), SpO2 97 %.  General: Alert, interactive, in no acute distress. HEENT: TMs pearly gray, turbinates mildly edematous without discharge, post-pharynx moderately erythematous. Neck: Supple  without lymphadenopathy. Lungs: Clear to auscultation without wheezing, rhonchi or rales. CV: Normal S1, S2 without murmurs. Skin: Warm and dry, without lesions or rashes.  The following portions of the patient's history were reviewed and updated as appropriate: allergies, current medications, past family history, past medical history, past social history, past surgical history and problem list.  Allergies as of 06/30/2017      Reactions   Grapefruit Extract Other (See Comments)   Blisters all over body   Lemon Oil Other (See Comments)   Blisters all over body   Lime, Sulfurated Other (See Comments)   Blisters all over body   Orange (diagnostic) Other (See Comments)   Blisters all over body   Penicillins    Topiramate Other (See Comments)      Medication List        Accurate as of 06/30/17 12:33 PM. Always use your most recent med list.          acetaminophen-codeine 300-30 MG tablet Commonly known as:  TYLENOL #3   albuterol (2.5 MG/3ML) 0.083% nebulizer solution Commonly known as:  PROVENTIL Take 2.5 mg by nebulization 3 times daily.   albuterol 108 (90 Base) MCG/ACT inhaler Commonly known as:  PROAIR HFA Inhale 2 puffs into the lungs every 6 (six) hours as needed for wheezing or shortness of breath.   cetirizine 10 MG tablet Commonly known as:  ZYRTEC TAKE 1 TABLET BY MOUTH EVERY DAY FOR RUNNY NOSE OR ITCHING   chlordiazePOXIDE 5 MG capsule Commonly known as:  LIBRIUM Take 5 mg by mouth 3 (three) times daily as needed for anxiety.   chlorthalidone 25 MG tablet Commonly known as:  HYGROTON TAKE 1 TABLET BY MOUTH EVERY DAY   citalopram 40 MG tablet Commonly known as:  CELEXA Take 40 mg by mouth daily.   cyanocobalamin 1000 MCG/ML injection Commonly known as:  (VITAMIN B-12) Inject into the muscle.   EPINEPHrine 0.3 mg/0.3 mL Soaj injection Commonly known as:  EPI-PEN Use as directed for severe allergic reaction   fluticasone 110 MCG/ACT inhaler Commonly  known as:  FLOVENT HFA Inhale 2 puffs into the lungs 2 (two) times daily.   fluticasone 50 MCG/ACT nasal spray Commonly known as:  FLONASE Place 2 sprays into both nostrils daily.   hydrOXYzine 25 MG tablet Commonly known as:  ATARAX/VISTARIL Take 25 mg by mouth 3 (three) times daily as needed.   metFORMIN 500 MG tablet Commonly known as:  GLUCOPHAGE TAKE 1 TABLET BY MOUTH EVERY EVENING FORDIABETES   montelukast 10 MG tablet Commonly known as:  SINGULAIR Take 1 tablet (10 mg total) by mouth at bedtime.   mupirocin ointment 2 % Commonly known as:  BACTROBAN Apply small dab twice daily as instructed for 10 days in both sides of the nose   naproxen sodium 220 MG tablet Commonly known as:  ALEVE Take 220 mg by mouth.   NONFORMULARY OR COMPOUNDED ITEM   ONE TOUCH ULTRA TEST test strip Generic drug:  glucose blood -TEST ONCE DAILY OR AS PHYSICIAN INSTRUCTED (E119 NIDDM)   ONETOUCH DELICA LANCETS 33G Misc CHECK BLOOD SUGAR ONCE  PER DAY OR AS DIRECTED (DX:E11.9)   Testosterone 20.25 MG/ACT (1.62%) Gel 2 pumps daily apply to skin as directed   testosterone cypionate 200 MG/ML injection Commonly known as:  DEPOTESTOSTERONE CYPIONATE Inject 200 mg into the muscle.   traZODone 50 MG tablet Commonly known as:  DESYREL Take 50 mg by mouth at bedtime.       Allergies  Allergen Reactions  . Grapefruit Extract Other (See Comments)    Blisters all over body  . Lemon Oil Other (See Comments)    Blisters all over body  . Lime, Sulfurated Other (See Comments)    Blisters all over body  . Orange (Diagnostic) Other (See Comments)    Blisters all over body  . Penicillins   . Topiramate Other (See Comments)   Review of systems: Review of systems negative except as noted in HPI / PMHx or noted below: Constitutional: Negative.  HENT: Negative.   Eyes: Negative.  Respiratory: Negative.   Cardiovascular: Negative.  Gastrointestinal: Negative.  Genitourinary: Negative.   Musculoskeletal: Negative.  Neurological: Negative.  Endo/Heme/Allergies: Negative.  Cutaneous: Negative.  Past Medical History:  Diagnosis Date  . Asthma   . Urticaria     Family History  Problem Relation Age of Onset  . Angioedema Neg Hx   . Allergic rhinitis Neg Hx   . Asthma Neg Hx   . Eczema Neg Hx   . Immunodeficiency Neg Hx   . Urticaria Neg Hx     Social History   Socioeconomic History  . Marital status: Married    Spouse name: Not on file  . Number of children: Not on file  . Years of education: Not on file  . Highest education level: Not on file  Social Needs  . Financial resource strain: Not on file  . Food insecurity - worry: Not on file  . Food insecurity - inability: Not on file  . Transportation needs - medical: Not on file  . Transportation needs - non-medical: Not on file  Occupational History  . Not on file  Tobacco Use  . Smoking status: Never Smoker  . Smokeless tobacco: Current User    Types: Chew  Substance and Sexual Activity  . Alcohol use: Yes    Alcohol/week: 0.0 oz  . Drug use: No  . Sexual activity: Not on file  Other Topics Concern  . Not on file  Social History Narrative  . Not on file    I appreciate the opportunity to take part in Luis Richardson's care. Please do not hesitate to contact me with questions.  Sincerely,   R. Jorene Guest, MD

## 2017-06-30 NOTE — Assessment & Plan Note (Addendum)
Currently with suboptimal control.  In addition, todays spirometry results, assessed while asymptomatic, suggest under-perception of bronchoconstriction.  A prescription has been provided for Flovent (fluticasone) 110 g, 2 inhalations twice a day. To maximize pulmonary deposition, a spacer has been provided along with instructions for its proper administration with an HFA inhaler.  Continue montelukast 10 mg daily bedtime and albuterol HFA, 1-2 inhalations every 6 hours if needed.  The patient has been asked to contact me if his symptoms persist or progress. Otherwise, he may return for follow up in 4 months.

## 2017-06-30 NOTE — Assessment & Plan Note (Signed)
The patient has requested a referral to otol[place]ology here in [place].  A referral has been made for evaluation and treatment by Dr. Suszanne Connerseoh.

## 2017-06-30 NOTE — Assessment & Plan Note (Signed)
Stable.  Continue appropriate allergen avoidance measures, aeroallergen immunotherapy as prescribed and as tolerated, montelukast daily, and fluticasone nasal spray as needed.

## 2017-06-30 NOTE — Patient Instructions (Addendum)
Moderate persistent asthma Currently with suboptimal control.  In addition, todays spirometry results, assessed while asymptomatic, suggest under-perception of bronchoconstriction.  A prescription has been provided for Flovent (fluticasone) 110 g,  2 inhalations twice a day. To maximize pulmonary deposition, a spacer has been provided along with instructions for its proper administration with an HFA inhaler.  Continue montelukast 10 mg daily bedtime and albuterol HFA, 1-2 inhalations every 6 hours if needed.  The patient has been asked to contact me if his symptoms persist or progress. Otherwise, he may return for follow up in 4 months.  Allergic rhinitis Stable.  Continue appropriate allergen avoidance measures, aeroallergen immunotherapy as prescribed and as tolerated, montelukast daily, and fluticasone nasal spray as needed.  Food allergy  Continue meticulous avoidance of oranges, lemons, and grapes and have access to epinephrine autoinjector 2 pack in case of accidental ingestion.  Food allergy action plan is in place.  Problems with hearing The patient has requested a referral to otolaryngology here in [place].  A referral has been made for evaluation and treatment by Dr. Suszanne Connerseoh.   Return in about 4 months (around 10/28/2017), or if symptoms worsen or fail to improve.

## 2017-06-30 NOTE — Assessment & Plan Note (Signed)
   Continue meticulous avoidance of oranges, lemons, and grapes and have access to epinephrine autoinjector 2 pack in case of accidental ingestion.  Food allergy action plan is in place.

## 2017-07-01 ENCOUNTER — Other Ambulatory Visit: Payer: Self-pay

## 2017-07-01 ENCOUNTER — Ambulatory Visit: Payer: Medicare Other | Admitting: Allergy and Immunology

## 2017-07-01 ENCOUNTER — Telehealth: Payer: Self-pay | Admitting: Allergy and Immunology

## 2017-07-01 DIAGNOSIS — J4540 Moderate persistent asthma, uncomplicated: Secondary | ICD-10-CM

## 2017-07-01 MED ORDER — FLUTICASONE PROPIONATE HFA 110 MCG/ACT IN AERO: 2.0000 | INHALATION_SPRAY | Freq: Two times a day (BID) | RESPIRATORY_TRACT | 5 refills | Status: DC

## 2017-07-01 NOTE — Telephone Encounter (Signed)
Patient was seen yesterday and wanted flovent sent in to [facility] on [address]  Patient states the meds are not there Please advise

## 2017-07-01 NOTE — Telephone Encounter (Signed)
Called and spoke with a lady who said that he no longer lives there.

## 2017-07-01 NOTE — Telephone Encounter (Signed)
Called and left message on cell phone, informing patient we have sent in medication to pharmacy he requested.

## 2017-07-01 NOTE — Telephone Encounter (Signed)
Flovent was sent in however it was sent in to a difference pharmacy. I have sent it in to the [facility] on [address].

## 2017-07-02 ENCOUNTER — Ambulatory Visit (INDEPENDENT_AMBULATORY_CARE_PROVIDER_SITE_OTHER): Payer: Medicare Other

## 2017-07-02 DIAGNOSIS — J309 Allergic rhinitis, unspecified: Secondary | ICD-10-CM

## 2017-07-14 DIAGNOSIS — J301 Allergic rhinitis due to pollen: Secondary | ICD-10-CM | POA: Diagnosis not present

## 2017-07-15 DIAGNOSIS — J3089 Other allergic rhinitis: Secondary | ICD-10-CM | POA: Diagnosis not present

## 2017-07-16 ENCOUNTER — Ambulatory Visit (INDEPENDENT_AMBULATORY_CARE_PROVIDER_SITE_OTHER): Payer: Medicare Other

## 2017-07-16 DIAGNOSIS — J309 Allergic rhinitis, unspecified: Secondary | ICD-10-CM

## 2017-07-17 NOTE — Progress Notes (Signed)
VIALS EXP 07-18-18 

## 2017-07-24 ENCOUNTER — Ambulatory Visit (INDEPENDENT_AMBULATORY_CARE_PROVIDER_SITE_OTHER): Payer: Medicare Other | Admitting: Family Medicine

## 2017-07-24 ENCOUNTER — Encounter: Payer: Self-pay | Admitting: Family Medicine

## 2017-07-24 VITALS — BP 126/78 | HR 73 | Temp 98.4°F | Resp 19

## 2017-07-24 DIAGNOSIS — J4541 Moderate persistent asthma with (acute) exacerbation: Secondary | ICD-10-CM | POA: Diagnosis not present

## 2017-07-24 DIAGNOSIS — T7800XD Anaphylactic reaction due to unspecified food, subsequent encounter: Secondary | ICD-10-CM | POA: Diagnosis not present

## 2017-07-24 DIAGNOSIS — J0110 Acute frontal sinusitis, unspecified: Secondary | ICD-10-CM | POA: Diagnosis not present

## 2017-07-24 DIAGNOSIS — H9193 Unspecified hearing loss, bilateral: Secondary | ICD-10-CM | POA: Diagnosis not present

## 2017-07-24 DIAGNOSIS — J4531 Mild persistent asthma with (acute) exacerbation: Secondary | ICD-10-CM | POA: Insufficient documentation

## 2017-07-24 DIAGNOSIS — J019 Acute sinusitis, unspecified: Secondary | ICD-10-CM | POA: Insufficient documentation

## 2017-07-24 MED ORDER — ALBUTEROL SULFATE (2.5 MG/3ML) 0.083% IN NEBU: 2.5000 mg | INHALATION_SOLUTION | RESPIRATORY_TRACT | 2 refills | Status: DC | PRN

## 2017-07-24 MED ORDER — ALBUTEROL SULFATE HFA 108 (90 BASE) MCG/ACT IN AERS: 2.0000 | INHALATION_SPRAY | RESPIRATORY_TRACT | 3 refills | Status: DC | PRN

## 2017-07-24 MED ORDER — ALBUTEROL SULFATE HFA 108 (90 BASE) MCG/ACT IN AERS: 2.0000 | INHALATION_SPRAY | Freq: Four times a day (QID) | RESPIRATORY_TRACT | 0 refills | Status: DC | PRN

## 2017-07-24 NOTE — Patient Instructions (Addendum)
1. Acute frontal sinusitis, recurrence not specified - Continue Flonase in both nostrils once a day for 2 weeks, then use as needed for a stuffy nose - Begin nasal saline rinses daily before medicated nasal sprays - Prednisone 10 mg. Take 2 tablets once a day for 4 days, then take 1 tablet one day, and then stop.   2. Mild persistent asthma with acute exacerbation - Continue montelukast 10 mg once a day - Begin Qvar 80- 2 puffs twice a day to prevent cough and wheeze. Sample and co-pay card provided - ProAir inhaler 2 puffs every 4 hours as needed for wheeze, cough, or shortness of breath - Prednisone as above  3. Anaphylactic shock due to food, subsequent encounter - Continue to avoid lemons, grapes, and oranges. - Continue to carry your EpiPen  4. Hearing problem of both ears - Follow up with Dr. Suszanne Connerseoh  Follow up in 3 months or sooner if needed

## 2017-07-24 NOTE — Progress Notes (Signed)
[address] [place] [id number] Dept: [phone]  FOLLOW UP NOTE  Patient ID: Luis AsaRandall Goodwill, male    DOB: 05/16/1961  Age: 57 y.o. MRN: [id number] Date of Office Visit: 07/24/2017  Assessment  Chief Complaint: Asthma and Shortness of Breath  HPI Luis Richardson is a 57 year old male who presents to the clinic for a sick visit. He was last seen in this clinic on 06/30/2017 by Dr. Nunzio CobbsBobbitt for evaluation of asthma, allergic rhinitis, and food allergy. At that visit, he was continued on montelukast 10 mg and started on Flovent 110- 2 puffs twice a day with a spacer. He was also continued on Flonase nasal spray.  At today's visit, Luis Richardson reports he has been feeling short of breath and wheezing, which is worse with activity, since Monday, 4 days ago. He has been taking montelukast 10 mg once a day and using his ProAir inhaler 1-2 times a day with little relief.  He reports he did not pick up the Flovent 110 that was ordered at last visit because it was too expensive.  He reports a headache which is worse when bending over, nasal congestion, and green nasal drainage that began on Monday.  Reports sinus pressure around both eyes.  He continues to use Flonase in both nares once a day. He is not currently using any nasal sprays.   He wears a hearing aid in his left ear and he reports both of his ears are full of pressure since his last visit here.  He has a current referral to Dr. Suszanne Connerseoh.   His current medications are listed in the chart.  Drug Allergies:  Allergies  Allergen Reactions  . Grapefruit Extract Other (See Comments)    Blisters all over body  . Lemon Oil Other (See Comments)    Blisters all over body  . Lime, Sulfurated Other (See Comments)    Blisters all over body  . Orange (Diagnostic) Other (See Comments)    Blisters all over body  . Penicillins   . Topiramate Other (See Comments)    Physical Exam: BP 126/78 (BP Location: Left Arm, Patient Position: Sitting)    Pulse 73   Temp 98.4 F (36.9 C)   Resp 19   SpO2 97%    Physical Exam  Constitutional: He is oriented to person, place, and time. He appears well-developed and well-nourished.  HENT:  Right Ear: External ear normal.  Left Ear: External ear normal.  Eyes normal. Ears normal. Bilateral nares erythematous and edematous with clear nasal drainage noted. Pharynx slightly erythematous.   Eyes: Conjunctivae are normal.  Neck: Normal range of motion. Neck supple.  Cardiovascular: Normal rate, regular rhythm and normal heart sounds.  S1S2 normal. Regular rate and rhythm. No murmur noted.   Pulmonary/Chest: Effort normal and breath sounds normal.  Bilateral scattered expiratory wheezes noted. Wheezes cleared post nebulizer treatment.   Musculoskeletal: Normal range of motion.  Neurological: He is alert and oriented to person, place, and time.  Skin: Skin is warm and dry.  Psychiatric: He has a normal mood and affect. His behavior is normal.    Diagnostics: FVC 3.54, FEV1 2.65.  Predicted FVC 4.26, predicted FEV1 3.26.  From a tree indicates normal ventilatory function.  Post bronchodilator spirometry indicates no significant bronchodilator response with FVC 3.09, FEV1 2.65.  Assessment and Plan: 1. Hearing problem of both ears   2. Acute frontal sinusitis, recurrence not specified   3. Moderate persistent asthma with acute exacerbation  4. Anaphylactic shock due to food, subsequent encounter     Meds ordered this encounter  Medications  . DISCONTD: albuterol (PROAIR HFA) 108 (90 Base) MCG/ACT inhaler    Sig: Inhale 2 puffs into the lungs every 6 (six) hours as needed for wheezing or shortness of breath.    Dispense:  1 Inhaler    Refill:  0  . albuterol (PROVENTIL) (2.5 MG/3ML) 0.083% nebulizer solution    Sig: Take 3 mLs (2.5 mg total) by nebulization every 4 (four) hours as needed for wheezing or shortness of breath.    Dispense:  75 mL    Refill:  2  . albuterol (PROAIR HFA)  108 (90 Base) MCG/ACT inhaler    Sig: Inhale 2 puffs into the lungs every 4 (four) hours as needed for wheezing or shortness of breath.    Dispense:  1 Inhaler    Refill:  3    Patient Instructions  1. Acute frontal sinusitis, recurrence not specified - Continue Flonase in both nostrils once a day for 2 weeks, then use as needed for a stuffy nose - Begin nasal saline rinses daily before medicated nasal sprays - Prednisone 10 mg. Take 2 tablets once a day for 4 days, then take 1 tablet one day, and then stop.   2. Mild persistent asthma with acute exacerbation - Continue montelukast 10 mg once a day - Begin Qvar 80- 2 puffs twice a day to prevent cough and wheeze. Sample and co-pay card provided - ProAir inhaler 2 puffs every 4 hours as needed for wheeze, cough, or shortness of breath - Prednisone as above  3. Anaphylactic shock due to food, subsequent encounter - Continue to avoid lemons, grapes, and oranges. - Continue to carry your EpiPen  4. Hearing problem of both ears - Follow up with Dr. Suszanne Conners  Follow up in 3 months or sooner if needed     Return in about 3 months (around 10/21/2017), or if symptoms worsen or fail to improve.    Thank you for the opportunity to care for this patient.  Please do not hesitate to contact me with questions.  Thermon Leyland, FNP [facility]    I reviewed the Nurse Practitioner's note and agree with the documented findings and plan of care. We briefly discussed the patient and developed a plan concurrently. We are working on helping him to afford his medications by providing samples and copay cards. I am optimistic that if he takes his medications, symptoms will be well-controlled.   Malachi Bonds, MD [facility]

## 2017-07-30 ENCOUNTER — Ambulatory Visit (INDEPENDENT_AMBULATORY_CARE_PROVIDER_SITE_OTHER): Payer: Medicare Other | Admitting: *Deleted

## 2017-07-30 DIAGNOSIS — J309 Allergic rhinitis, unspecified: Secondary | ICD-10-CM

## 2017-07-31 ENCOUNTER — Ambulatory Visit (INDEPENDENT_AMBULATORY_CARE_PROVIDER_SITE_OTHER): Payer: Medicare Other | Admitting: Otolaryngology

## 2017-07-31 DIAGNOSIS — H903 Sensorineural hearing loss, bilateral: Secondary | ICD-10-CM

## 2017-07-31 DIAGNOSIS — H6983 Other specified disorders of Eustachian tube, bilateral: Secondary | ICD-10-CM

## 2017-07-31 DIAGNOSIS — H9203 Otalgia, bilateral: Secondary | ICD-10-CM | POA: Diagnosis not present

## 2017-08-13 ENCOUNTER — Ambulatory Visit (INDEPENDENT_AMBULATORY_CARE_PROVIDER_SITE_OTHER): Payer: Medicare Other | Admitting: *Deleted

## 2017-08-13 DIAGNOSIS — J309 Allergic rhinitis, unspecified: Secondary | ICD-10-CM | POA: Diagnosis not present

## 2017-08-27 ENCOUNTER — Ambulatory Visit (INDEPENDENT_AMBULATORY_CARE_PROVIDER_SITE_OTHER): Payer: Medicare Other

## 2017-08-27 DIAGNOSIS — J309 Allergic rhinitis, unspecified: Secondary | ICD-10-CM

## 2017-08-30 ENCOUNTER — Ambulatory Visit (HOSPITAL_COMMUNITY)
Admission: EM | Admit: 2017-08-30 | Discharge: 2017-08-30 | Disposition: A | Discharge status: Home / Self Care | Payer: Medicare Other | Attending: Family Medicine | Admitting: Family Medicine

## 2017-08-30 ENCOUNTER — Encounter (HOSPITAL_COMMUNITY): Payer: Self-pay | Admitting: Family Medicine

## 2017-08-30 DIAGNOSIS — H1013 Acute atopic conjunctivitis, bilateral: Secondary | ICD-10-CM

## 2017-08-30 DIAGNOSIS — J302 Other seasonal allergic rhinitis: Secondary | ICD-10-CM | POA: Diagnosis not present

## 2017-08-30 MED ORDER — KETOTIFEN FUMARATE 0.025 % OP SOLN: 1.0000 [drp] | Freq: Two times a day (BID) | OPHTHALMIC | 0 refills | Status: DC

## 2017-08-30 NOTE — Discharge Instructions (Signed)
Continue with your prescribed regimen of allergy medications. Please start twice a day drops as well. Please continue to follow with your primary care provider as well as your allergist for persistent or worsening of symptoms.

## 2017-08-30 NOTE — ED Triage Notes (Addendum)
Pt here for left eyelid swelling. Also feels like hi ears are stopped up. Pt is hard of hearing

## 2017-08-30 NOTE — ED Provider Notes (Signed)
[facility]    CSN: [id number] Arrival date & time: 08/30/17  1812     History   Chief Complaint Chief Complaint  Patient presents with  . Eye Problem    HPI Luis Richardson is a 57 y.o. male.   Luis Richardson presents with complaints of bilateral eyes that are itchy and tearing which started after he stopped his allergy eye drops two days ago. History of allergies, has been getting allergy shots every week. Last on Wednesday. Uses flonase as well as zyrtec. States has similar every year at this season. Without vision change or redness. No eye pain. States his ear pops as well. Saw ENT 3/12. History of hard of hearing, uses a hearing aid. History of asthma and sinusitis, htn.     ROS per HPI.      Past Medical History:  Diagnosis Date  . Asthma   . Urticaria     Patient Active Problem List   Diagnosis Date Noted  . Mild persistent asthma with acute exacerbation 07/24/2017  . Acute frontal sinusitis 07/24/2017  . Problems with hearing 06/30/2017  . Hypertension 09/11/2016  . Moderate persistent asthma 01/09/2016  . Anaphylactic shock due to adverse food reaction 07/03/2015  . Allergic rhinitis 02/22/2015    Past Surgical History:  Procedure Laterality Date  . ADENOIDECTOMY    . HERNIA REPAIR    . TESTICLE SURGERY    . TONSILLECTOMY         Home Medications    Prior to Admission medications   Medication Sig Start Date End Date Taking? Authorizing Provider  acetaminophen-codeine (TYLENOL #3) 300-30 MG tablet  11/01/15   [provider]  albuterol (PROAIR HFA) 108 (90 Base) MCG/ACT inhaler Inhale 2 puffs into the lungs every 4 (four) hours as needed for wheezing or shortness of breath. 07/24/17   Hetty Blend, FNP  albuterol (PROVENTIL) (2.5 MG/3ML) 0.083% nebulizer solution Take 3 mLs (2.5 mg total) by nebulization every 4 (four) hours as needed for wheezing or shortness of breath. 07/24/17   Hetty Blend, FNP  cetirizine (ZYRTEC) 10 MG tablet  TAKE 1 TABLET BY MOUTH EVERY DAY FOR RUNNY NOSE OR ITCHING 06/09/15   Bobbitt, Heywood Iles, MD  chlordiazePOXIDE (LIBRIUM) 5 MG capsule Take 5 mg by mouth 3 (three) times daily as needed for anxiety.    [provider]  chlorthalidone (HYGROTON) 25 MG tablet TAKE 1 TABLET BY MOUTH EVERY DAY 12/06/15   [provider]  citalopram (CELEXA) 40 MG tablet Take 40 mg by mouth daily.    [provider]  cyanocobalamin (,VITAMIN B-12,) 1000 MCG/ML injection Inject into the muscle. 09/13/15   [provider]  EPINEPHrine 0.3 mg/0.3 mL IJ SOAJ injection Use as directed for severe allergic reaction 01/09/16   Fletcher Anon, MD  fluticasone (FLONASE) 50 MCG/ACT nasal spray Place 2 sprays into both nostrils daily. 06/30/17   Bobbitt, Heywood Iles, MD  fluticasone (FLOVENT HFA) 110 MCG/ACT inhaler Inhale 2 puffs into the lungs 2 (two) times daily. 07/01/17   Bobbitt, Heywood Iles, MD  glucose blood (ONE TOUCH ULTRA TEST) test strip -TEST ONCE DAILY OR AS PHYSICIAN INSTRUCTED (E119 NIDDM) 10/05/15   [provider]  hydrOXYzine (ATARAX/VISTARIL) 25 MG tablet Take 25 mg by mouth 3 (three) times daily as needed.    [provider]  ketotifen (ZADITOR) 0.025 % ophthalmic solution Place 1 drop into both eyes 2 (two) times daily. 08/30/17   Georgetta Haber, NP  metFORMIN (  GLUCOPHAGE) 500 MG tablet TAKE 1 TABLET BY MOUTH EVERY EVENING FORDIABETES 05/19/15   [provider]  montelukast (SINGULAIR) 10 MG tablet Take 1 tablet (10 mg total) by mouth at bedtime. 06/30/17   Bobbitt, Heywood Iles, MD  mupirocin ointment (BACTROBAN) 2 % Apply small dab twice daily as instructed for 10 days in both sides of the nose 06/11/16   Marcelyn Bruins, MD  naproxen sodium (ANAPROX) 220 MG tablet Take 220 mg by mouth.    [provider]  NONFORMULARY OR COMPOUNDED ITEM     [provider]  ONETOUCH DELICA LANCETS 33G MISC CHECK BLOOD SUGAR ONCE PER DAY OR  AS DIRECTED (DX:E11.9) 07/02/15   [provider]  Testosterone 20.25 MG/ACT (1.62%) GEL 2 pumps daily apply to skin as directed 10/26/15   [provider]  testosterone cypionate (DEPOTESTOSTERONE CYPIONATE) 200 MG/ML injection Inject 200 mg into the muscle. 08/08/16   [provider]  traZODone (DESYREL) 50 MG tablet Take 50 mg by mouth at bedtime.    [provider]    Family History Family History  Problem Relation Age of Onset  . Angioedema Neg Hx   . Allergic rhinitis Neg Hx   . Asthma Neg Hx   . Eczema Neg Hx   . Immunodeficiency Neg Hx   . Urticaria Neg Hx     Social History Social History   Tobacco Use  . Smoking status: Never Smoker  . Smokeless tobacco: Current User    Types: Chew  Substance Use Topics  . Alcohol use: Yes    Alcohol/week: 0.0 oz  . Drug use: No     Allergies   Grapefruit extract; Lemon oil; Lime, sulfurated; Orange (diagnostic); Penicillins; and Topiramate   Review of Systems Review of Systems   Physical Exam Triage Vital Signs ED Triage Vitals [08/30/17 1821]  Enc Vitals Group     BP (!) 165/106     Pulse Rate 76     Resp (!) 187     Temp 98.5 F (36.9 C)     Temp src      SpO2 98 %     Weight      Height      Head Circumference      Peak Flow      Pain Score      Pain Loc      Pain Edu?      Excl. in GC?    No data found.  Updated Vital Signs BP (!) 165/106   Pulse 76   Temp 98.5 F (36.9 C)   Resp (!) 187   SpO2 98%   Visual Acuity Right Eye Distance:   Left Eye Distance:   Bilateral Distance:    Right Eye Near:   Left Eye Near:    Bilateral Near:     Physical Exam  Constitutional: He is oriented to person, place, and time. He appears well-developed and well-nourished.  HENT:  Head: Normocephalic and atraumatic.  Right Ear: Tympanic membrane, external ear and ear canal normal.  Left Ear: Tympanic membrane, external ear and ear canal normal.  Nose: Nose normal. Right sinus  exhibits no maxillary sinus tenderness and no frontal sinus tenderness. Left sinus exhibits no maxillary sinus tenderness and no frontal sinus tenderness.  Mouth/Throat: Uvula is midline, oropharynx is clear and moist and mucous membranes are normal.  Eyes: Pupils are equal, round, and reactive to light. Conjunctivae and EOM are normal. Right eye exhibits no discharge. Left  eye exhibits no discharge.  Mild redness noted to left lower eye lid; without discharge noted to eyes and without injection   Neck: Normal range of motion.  Cardiovascular: Normal rate and regular rhythm.  Pulmonary/Chest: Effort normal and breath sounds normal.  Lymphadenopathy:    He has no cervical adenopathy.  Neurological: He is alert and oriented to person, place, and time.  Skin: Skin is warm and dry.  Vitals reviewed.    UC Treatments / Results  Labs (all labs ordered are listed, but only abnormal results are displayed) Labs Reviewed - No data to display  EKG None Radiology No results found.  Procedures Procedures (including critical care time)  Medications Ordered in UC Medications - No data to display   Initial Impression / Assessment and Plan / UC Course  I have reviewed the triage vital signs and the nursing notes.  Pertinent labs & imaging results that were available during my care of the patient were reviewed by me and considered in my medical decision making (see chart for details).     Appears consistent with allergic in nature. Eye drops provided. Continue with previously prescribed regimen of medications for allergies. If symptoms worsen or do not improve in the next week to return to be seen or to follow up with PCP.  Patient verbalized understanding and agreeable to plan.    Final Clinical Impressions(s) / UC Diagnoses   Final diagnoses:  Seasonal allergies  Allergic conjunctivitis of both eyes    ED Discharge Orders        Ordered    ketotifen (ZADITOR) 0.025 % ophthalmic  solution  2 times daily     08/30/17 1845       Controlled Substance Prescriptions [place] Controlled Substance Registry consulted? Not Applicable   Georgetta Haber, NP 08/30/17 [phone]

## 2017-09-03 ENCOUNTER — Ambulatory Visit (INDEPENDENT_AMBULATORY_CARE_PROVIDER_SITE_OTHER): Payer: Medicare Other | Admitting: *Deleted

## 2017-09-03 DIAGNOSIS — J309 Allergic rhinitis, unspecified: Secondary | ICD-10-CM

## 2017-09-10 ENCOUNTER — Ambulatory Visit (INDEPENDENT_AMBULATORY_CARE_PROVIDER_SITE_OTHER): Payer: Medicare Other | Admitting: *Deleted

## 2017-09-10 DIAGNOSIS — J309 Allergic rhinitis, unspecified: Secondary | ICD-10-CM | POA: Diagnosis not present

## 2017-09-17 ENCOUNTER — Ambulatory Visit (INDEPENDENT_AMBULATORY_CARE_PROVIDER_SITE_OTHER): Payer: Medicare Other | Admitting: *Deleted

## 2017-09-17 DIAGNOSIS — J309 Allergic rhinitis, unspecified: Secondary | ICD-10-CM

## 2017-09-24 ENCOUNTER — Ambulatory Visit (INDEPENDENT_AMBULATORY_CARE_PROVIDER_SITE_OTHER): Payer: Medicare Other | Admitting: *Deleted

## 2017-09-24 DIAGNOSIS — J309 Allergic rhinitis, unspecified: Secondary | ICD-10-CM

## 2017-10-08 ENCOUNTER — Ambulatory Visit (INDEPENDENT_AMBULATORY_CARE_PROVIDER_SITE_OTHER): Payer: Medicare Other

## 2017-10-08 DIAGNOSIS — J309 Allergic rhinitis, unspecified: Secondary | ICD-10-CM | POA: Diagnosis not present

## 2017-10-13 ENCOUNTER — Other Ambulatory Visit: Payer: Self-pay

## 2017-10-13 DIAGNOSIS — J3089 Other allergic rhinitis: Secondary | ICD-10-CM

## 2017-10-13 DIAGNOSIS — E538 Deficiency of other specified B group vitamins: Secondary | ICD-10-CM | POA: Insufficient documentation

## 2017-10-13 NOTE — Telephone Encounter (Signed)
Refill on fluticasone denied at [facility]. Pt needs an OV

## 2017-10-15 ENCOUNTER — Ambulatory Visit: Payer: Self-pay | Admitting: Nurse Practitioner

## 2017-10-21 ENCOUNTER — Other Ambulatory Visit: Payer: Self-pay

## 2017-10-21 ENCOUNTER — Other Ambulatory Visit: Payer: Self-pay | Admitting: Allergy and Immunology

## 2017-10-21 DIAGNOSIS — J3089 Other allergic rhinitis: Secondary | ICD-10-CM

## 2017-10-21 MED ORDER — FLUTICASONE PROPIONATE 50 MCG/ACT NA SUSP: 2.0000 | Freq: Every day | NASAL | 0 refills | Status: DC

## 2017-10-22 ENCOUNTER — Ambulatory Visit (INDEPENDENT_AMBULATORY_CARE_PROVIDER_SITE_OTHER): Payer: Medicare Other | Admitting: *Deleted

## 2017-10-22 DIAGNOSIS — J309 Allergic rhinitis, unspecified: Secondary | ICD-10-CM

## 2017-10-27 ENCOUNTER — Ambulatory Visit (INDEPENDENT_AMBULATORY_CARE_PROVIDER_SITE_OTHER): Payer: Medicare Other | Admitting: Allergy and Immunology

## 2017-10-27 ENCOUNTER — Encounter: Payer: Self-pay | Admitting: Allergy and Immunology

## 2017-10-27 VITALS — BP 138/92 | HR 58 | Resp 16

## 2017-10-27 DIAGNOSIS — J4540 Moderate persistent asthma, uncomplicated: Secondary | ICD-10-CM

## 2017-10-27 DIAGNOSIS — I10 Essential (primary) hypertension: Secondary | ICD-10-CM

## 2017-10-27 DIAGNOSIS — H9193 Unspecified hearing loss, bilateral: Secondary | ICD-10-CM

## 2017-10-27 DIAGNOSIS — J3089 Other allergic rhinitis: Secondary | ICD-10-CM

## 2017-10-27 MED ORDER — AZELASTINE HCL 0.1 % NA SOLN: 2.0000 | Freq: Two times a day (BID) | NASAL | 5 refills | Status: DC

## 2017-10-27 NOTE — Assessment & Plan Note (Addendum)
   For now, continue montelukast 10 mg daily at bedtime, Qvar 80 g ready inhaler, 2 inhalations twice daily, and albuterol, 1 to 2 inhalations every 6 hours if needed.  During respiratory tract infections or asthma flares, increase Qvar 80 g to 3 inhalations 3 times per day until symptoms have returned to baseline.  Subjective and objective measures of pulmonary function will be followed and the treatment plan will be adjusted accordingly.

## 2017-10-27 NOTE — Assessment & Plan Note (Signed)
   Follow-up with Dr. Suszanne Conners as recommended.

## 2017-10-27 NOTE — Patient Instructions (Addendum)
Allergic rhinitis  Continue appropriate allergen avoidance measures and montelukast 10 mg daily bedtime.  A prescription has been provided for azelastine nasal spray, 2 sprays per nostril twice daily if needed.  For now, continue fluticasone nasal spray, 1 spray per nostril twice daily if needed.  Nasal saline spray (i.e., Simply Saline) or nasal saline lavage (i.e., NeilMed) is recommended as needed and prior to medicated nasal sprays.  For thick post nasal drainage, add guaifenesin 1200 mg (Mucinex Maximum Strength)  twice daily as needed with adequate hydration as discussed.  Moderate persistent asthma  For now, continue montelukast 10 mg daily at bedtime, Qvar 80 g ready inhaler, 2 inhalations twice daily, and albuterol, 1 to 2 inhalations every 6 hours if needed.  During respiratory tract infections or asthma flares, increase Qvar 80 g to 3 inhalations 3 times per day until symptoms have returned to baseline.  Subjective and objective measures of pulmonary function will be followed and the treatment plan will be adjusted accordingly.  Hypertension Blood pressure was elevated today.  Follow up with his primary care physician regarding this issue.  For now, continue antihypertensives as prescribed.  Problems with hearing  Follow-up with Dr. Suszanne Conners as recommended.   Return in about 4 or 5 months, or if symptoms worsen or fail to improve.

## 2017-10-27 NOTE — Assessment & Plan Note (Signed)
Blood pressure was elevated today.  Follow up with his primary care physician regarding this issue.  For now, continue antihypertensives as prescribed.

## 2017-10-27 NOTE — Progress Notes (Signed)
Follow-up Note  RE: Luis Richardson MRN: [id number] DOB: [birth year] Date of Office Visit: 10/27/2017  Primary care provider: Oletha Blend, MD Referring provider: Oletha Blend, MD  History of present illness: Luis Richardson is a 57 y.o. male  with persistent asthma and allergic rhinitis presenting today for follow-up.  He is accompanied today by a sign language interpreter who assists with the history.  He was last seen in this clinic on June 30, 2017.  He complains of intermittent ear pressure/clogging despite compliance with fluticasone nasal spray.  He reports that he was prescribed an oral medication by Dr. Suszanne Conners, however he does not recall the name of this medication.  His asthma has been relatively well controlled in the interval since his previous visit.  He currently takes Qvar 80 g, 2 inhalations via spacer device twice daily.  He typically requires albuterol rescue 1 time per week on average and rarely experiences nocturnal awakenings due to lower respiratory symptoms.  Assessment and plan: Allergic rhinitis  Continue appropriate allergen avoidance measures and montelukast 10 mg daily bedtime.  A prescription has been provided for azelastine nasal spray, 2 sprays per nostril twice daily if needed.  For now, continue fluticasone nasal spray, 1 spray per nostril twice daily if needed.  Nasal saline spray (i.e., Simply Saline) or nasal saline lavage (i.e., NeilMed) is recommended as needed and prior to medicated nasal sprays.  For thick post nasal drainage, add guaifenesin 1200 mg (Mucinex Maximum Strength)  twice daily as needed with adequate hydration as discussed.  Moderate persistent asthma  For now, continue montelukast 10 mg daily at bedtime, Qvar 80 g ready inhaler, 2 inhalations twice daily, and albuterol, 1 to 2 inhalations every 6 hours if needed.  During respiratory tract infections or asthma flares, increase Qvar 80 g to 3 inhalations 3 times per day until  symptoms have returned to baseline.  Subjective and objective measures of pulmonary function will be followed and the treatment plan will be adjusted accordingly.  Hypertension Blood pressure was elevated today.  Follow up with his primary care physician regarding this issue.  For now, continue antihypertensives as prescribed.  Problems with hearing  Follow-up with Dr. Suszanne Conners as recommended.   Meds ordered this encounter  Medications  . azelastine (ASTELIN) 0.1 % nasal spray    Sig: Place 2 sprays into both nostrils 2 (two) times daily. Use in each nostril as directed    Dispense:  30 mL    Refill:  5    Diagnostics: Spirometry:  Normal with an FEV1 of 84% predicted with an FEV1 ratio of 103%.  Please see scanned spirometry results for details.    Physical examination: Blood pressure (!) 138/92, pulse (!) 58, resp. rate 16, SpO2 96 %.  General: Alert, interactive, in no acute distress. HEENT: TMs pearly gray, turbinates moderately edematous without discharge, post-pharynx mildly erythematous. Neck: Supple without lymphadenopathy. Lungs: Clear to auscultation without wheezing, rhonchi or rales. CV: Normal S1, S2 without murmurs. Skin: Warm and dry, without lesions or rashes.  The following portions of the patient's history were reviewed and updated as appropriate: allergies, current medications, past family history, past medical history, past social history, past surgical history and problem list.  Allergies as of 10/27/2017      Reactions   Grapefruit Extract Other (See Comments)   Blisters all over body   Lemon Oil Other (See Comments)   Blisters all over body   Lime, Sulfurated Other (See Comments)   Blisters  all over body   Orange (diagnostic) Other (See Comments)   Blisters all over body   Penicillins    Topiramate Other (See Comments)      Medication List        Accurate as of 10/27/17  1:43 PM. Always use your most recent med list.            acetaminophen-codeine 300-30 MG tablet Commonly known as:  TYLENOL #3   albuterol (2.5 MG/3ML) 0.083% nebulizer solution Commonly known as:  PROVENTIL Take 3 mLs (2.5 mg total) by nebulization every 4 (four) hours as needed for wheezing or shortness of breath.   albuterol 108 (90 Base) MCG/ACT inhaler Commonly known as:  PROAIR HFA Inhale 2 puffs into the lungs every 4 (four) hours as needed for wheezing or shortness of breath.   azelastine 0.1 % nasal spray Commonly known as:  ASTELIN Place 2 sprays into both nostrils 2 (two) times daily. Use in each nostril as directed   cetirizine 10 MG tablet Commonly known as:  ZYRTEC TAKE 1 TABLET BY MOUTH EVERY DAY FOR RUNNY NOSE OR ITCHING   chlordiazePOXIDE 5 MG capsule Commonly known as:  LIBRIUM Take 5 mg by mouth 3 (three) times daily as needed for anxiety.   chlorthalidone 25 MG tablet Commonly known as:  HYGROTON TAKE 1 TABLET BY MOUTH EVERY DAY   citalopram 40 MG tablet Commonly known as:  CELEXA Take 40 mg by mouth daily.   cyanocobalamin 1000 MCG/ML injection Commonly known as:  (VITAMIN B-12) Inject into the muscle.   EPINEPHrine 0.3 mg/0.3 mL Soaj injection Commonly known as:  EPI-PEN Use as directed for severe allergic reaction   fluticasone 110 MCG/ACT inhaler Commonly known as:  FLOVENT HFA Inhale 2 puffs into the lungs 2 (two) times daily.   fluticasone 50 MCG/ACT nasal spray Commonly known as:  FLONASE USE 2 SPRAYS IN EACH NOSTRIL DAILY FOR STUFFY NOSE OR DRAINAGE   hydrOXYzine 25 MG tablet Commonly known as:  ATARAX/VISTARIL Take 25 mg by mouth 3 (three) times daily as needed.   ketotifen 0.025 % ophthalmic solution Commonly known as:  ZADITOR Place 1 drop into both eyes 2 (two) times daily.   metFORMIN 500 MG tablet Commonly known as:  GLUCOPHAGE TAKE 1 TABLET BY MOUTH EVERY EVENING FORDIABETES   montelukast 10 MG tablet Commonly known as:  SINGULAIR Take 1 tablet (10 mg total) by mouth at  bedtime.   mupirocin ointment 2 % Commonly known as:  BACTROBAN Apply small dab twice daily as instructed for 10 days in both sides of the nose   naproxen sodium 220 MG tablet Commonly known as:  ALEVE Take 220 mg by mouth.   NONFORMULARY OR COMPOUNDED ITEM   ONE TOUCH ULTRA TEST test strip Generic drug:  glucose blood -TEST ONCE DAILY OR AS PHYSICIAN INSTRUCTED (E119 NIDDM)   ONETOUCH DELICA LANCETS 33G Misc CHECK BLOOD SUGAR ONCE PER DAY OR AS DIRECTED (DX:E11.9)   Testosterone 20.25 MG/ACT (1.62%) Gel 2 pumps daily apply to skin as directed   testosterone cypionate 200 MG/ML injection Commonly known as:  DEPOTESTOSTERONE CYPIONATE Inject 200 mg into the muscle.   traZODone 50 MG tablet Commonly known as:  DESYREL Take 50 mg by mouth at bedtime.       Allergies  Allergen Reactions  . Grapefruit Extract Other (See Comments)    Blisters all over body  . Lemon Oil Other (See Comments)    Blisters all over body  . Lime, Sulfurated  Other (See Comments)    Blisters all over body  . Orange (Diagnostic) Other (See Comments)    Blisters all over body  . Penicillins   . Topiramate Other (See Comments)   Review of systems: Review of systems negative except as noted in HPI / PMHx or noted below: Constitutional: Negative.  HENT: Negative.   Eyes: Negative.  Respiratory: Negative.   Cardiovascular: Negative.  Gastrointestinal: Negative.  Genitourinary: Negative.  Musculoskeletal: Negative.  Neurological: Negative.  Endo/Heme/Allergies: Negative.  Cutaneous: Negative.  Past Medical History:  Diagnosis Date  . Asthma   . Urticaria     Family History  Problem Relation Age of Onset  . Angioedema Neg Hx   . Allergic rhinitis Neg Hx   . Asthma Neg Hx   . Eczema Neg Hx   . Immunodeficiency Neg Hx   . Urticaria Neg Hx     Social History   Socioeconomic History  . Marital status: Married    Spouse name: Not on file  . Number of children: Not on file  .  Years of education: Not on file  . Highest education level: Not on file  Occupational History  . Not on file  Social Needs  . Financial resource strain: Not on file  . Food insecurity:    Worry: Not on file    Inability: Not on file  . Transportation needs:    Medical: Not on file    Non-medical: Not on file  Tobacco Use  . Smoking status: Never Smoker  . Smokeless tobacco: Current User    Types: Chew  Substance and Sexual Activity  . Alcohol use: Yes    Alcohol/week: 0.0 oz  . Drug use: No  . Sexual activity: Not on file  Lifestyle  . Physical activity:    Days per week: Not on file    Minutes per session: Not on file  . Stress: Not on file  Relationships  . Social connections:    Talks on phone: Not on file    Gets together: Not on file    Attends religious service: Not on file    Active member of club or organization: Not on file    Attends meetings of clubs or organizations: Not on file    Relationship status: Not on file  . Intimate partner violence:    Fear of current or ex partner: Not on file    Emotionally abused: Not on file    Physically abused: Not on file    Forced sexual activity: Not on file  Other Topics Concern  . Not on file  Social History Narrative  . Not on file    I appreciate the opportunity to take part in Zaccai's care. Please do not hesitate to contact me with questions.  Sincerely,   R. Jorene Guest, MD

## 2017-10-27 NOTE — Assessment & Plan Note (Addendum)
   Continue appropriate allergen avoidance measures and montelukast 10 mg daily bedtime.  A prescription has been provided for azelastine nasal spray, 2 sprays per nostril twice daily if needed.  For now, continue fluticasone nasal spray, 1 spray per nostril twice daily if needed.  Nasal saline spray (i.e., Simply Saline) or nasal saline lavage (i.e., NeilMed) is recommended as needed and prior to medicated nasal sprays.  For thick post nasal drainage, add guaifenesin 1200 mg (Mucinex Maximum Strength)  twice daily as needed with adequate hydration as discussed.

## 2017-11-05 ENCOUNTER — Ambulatory Visit (INDEPENDENT_AMBULATORY_CARE_PROVIDER_SITE_OTHER): Payer: Medicare Other

## 2017-11-05 DIAGNOSIS — J3089 Other allergic rhinitis: Secondary | ICD-10-CM

## 2017-11-12 ENCOUNTER — Ambulatory Visit (INDEPENDENT_AMBULATORY_CARE_PROVIDER_SITE_OTHER): Payer: Medicare Other | Admitting: *Deleted

## 2017-11-12 DIAGNOSIS — J309 Allergic rhinitis, unspecified: Secondary | ICD-10-CM | POA: Diagnosis not present

## 2017-11-19 ENCOUNTER — Telehealth: Payer: Self-pay

## 2017-11-19 ENCOUNTER — Ambulatory Visit (INDEPENDENT_AMBULATORY_CARE_PROVIDER_SITE_OTHER): Payer: Medicare Other

## 2017-11-19 DIAGNOSIS — J309 Allergic rhinitis, unspecified: Secondary | ICD-10-CM

## 2017-11-19 NOTE — Telephone Encounter (Signed)
At maintenance dose, there should not be clinical benefit coming in more frequently than every 2 weeks. If he is having problems with his allergy symptoms it may not be a bad idea to have him scheduled for a retest. It's ok to put him on my schedule in a follow up spot for the retest. Thanks.

## 2017-11-19 NOTE — Telephone Encounter (Signed)
Patient came in today to get his injection and he is a week early and he stated that he told the nurse last week that he wanted to come every week due to his allergies flaring up before his next injection. Please advise

## 2017-11-20 NOTE — Telephone Encounter (Signed)
Called and left message for pt to call office in regards to this matter

## 2017-11-21 DIAGNOSIS — J301 Allergic rhinitis due to pollen: Secondary | ICD-10-CM | POA: Diagnosis not present

## 2017-11-24 DIAGNOSIS — J3089 Other allergic rhinitis: Secondary | ICD-10-CM | POA: Diagnosis not present

## 2017-11-26 ENCOUNTER — Ambulatory Visit (INDEPENDENT_AMBULATORY_CARE_PROVIDER_SITE_OTHER): Payer: Medicare Other | Admitting: *Deleted

## 2017-11-26 DIAGNOSIS — J309 Allergic rhinitis, unspecified: Secondary | ICD-10-CM | POA: Diagnosis not present

## 2017-11-26 NOTE — Telephone Encounter (Signed)
Called and left a message for patient to call office to inform him of what Dr. Nunzio CobbsBobbitt recommended.

## 2017-11-27 NOTE — Telephone Encounter (Signed)
Spoke with patient yesterday about Dr. Maren ReamerBobbitts recommendation patient understood but feels getting them every week is more beneficial to him.

## 2017-12-03 ENCOUNTER — Ambulatory Visit (INDEPENDENT_AMBULATORY_CARE_PROVIDER_SITE_OTHER): Payer: Medicare Other

## 2017-12-03 DIAGNOSIS — J309 Allergic rhinitis, unspecified: Secondary | ICD-10-CM

## 2017-12-05 ENCOUNTER — Encounter: Payer: Self-pay | Admitting: Family Medicine

## 2017-12-05 ENCOUNTER — Ambulatory Visit (INDEPENDENT_AMBULATORY_CARE_PROVIDER_SITE_OTHER): Payer: Medicare Other | Admitting: Family Medicine

## 2017-12-05 VITALS — BP 132/80 | HR 66 | Temp 98.2°F | Resp 16 | Ht 66.75 in | Wt 295.2 lb

## 2017-12-05 DIAGNOSIS — I10 Essential (primary) hypertension: Secondary | ICD-10-CM

## 2017-12-05 DIAGNOSIS — R7303 Prediabetes: Secondary | ICD-10-CM

## 2017-12-05 DIAGNOSIS — G4733 Obstructive sleep apnea (adult) (pediatric): Secondary | ICD-10-CM

## 2017-12-05 DIAGNOSIS — E291 Testicular hypofunction: Secondary | ICD-10-CM

## 2017-12-05 DIAGNOSIS — Z6841 Body Mass Index (BMI) 40.0 and over, adult: Secondary | ICD-10-CM | POA: Diagnosis not present

## 2017-12-05 DIAGNOSIS — F324 Major depressive disorder, single episode, in partial remission: Secondary | ICD-10-CM | POA: Diagnosis not present

## 2017-12-05 DIAGNOSIS — J4540 Moderate persistent asthma, uncomplicated: Secondary | ICD-10-CM

## 2017-12-05 DIAGNOSIS — J3089 Other allergic rhinitis: Secondary | ICD-10-CM | POA: Diagnosis not present

## 2017-12-05 DIAGNOSIS — R059 Cough, unspecified: Secondary | ICD-10-CM

## 2017-12-05 DIAGNOSIS — G4700 Insomnia, unspecified: Secondary | ICD-10-CM

## 2017-12-05 DIAGNOSIS — R05 Cough: Secondary | ICD-10-CM | POA: Diagnosis not present

## 2017-12-05 DIAGNOSIS — Z9989 Dependence on other enabling machines and devices: Secondary | ICD-10-CM | POA: Diagnosis not present

## 2017-12-05 LAB — CBC
HCT: 45.7 % (ref 39.0–52.0)
Hemoglobin: 15.1 g/dL (ref 13.0–17.0)
MCHC: 33 g/dL (ref 30.0–36.0)
MCV: 85 fl (ref 78.0–100.0)
PLATELETS: 244 10*3/uL (ref 150.0–400.0)
RBC: 5.38 Mil/uL (ref 4.22–5.81)
RDW: 15.2 % (ref 11.5–15.5)
WBC: 8.9 10*3/uL (ref 4.0–10.5)

## 2017-12-05 LAB — HEPATIC FUNCTION PANEL
ALT: 11 U/L (ref 0–53)
AST: 11 U/L (ref 0–37)
Albumin: 4.1 g/dL (ref 3.5–5.2)
Alkaline Phosphatase: 83 U/L (ref 39–117)
BILIRUBIN DIRECT: 0.1 mg/dL (ref 0.0–0.3)
BILIRUBIN TOTAL: 0.5 mg/dL (ref 0.2–1.2)
TOTAL PROTEIN: 6.7 g/dL (ref 6.0–8.3)

## 2017-12-05 LAB — TESTOSTERONE: Testosterone: 241.89 ng/dL — ABNORMAL LOW (ref 300.00–890.00)

## 2017-12-05 NOTE — Patient Instructions (Addendum)
A few things to remember from today's visit:   Allergic rhinitis  Essential hypertension  Male hypogonadism - Plan: Testosterone, CBC, Hepatic function panel  Regular exercise and a healthy diet.  DASH Eating Plan DASH stands for "Dietary Approaches to Stop Hypertension." The DASH eating plan is a healthy eating plan that has been shown to reduce high blood pressure (hypertension). It may also reduce your risk for type 2 diabetes, heart disease, and stroke. The DASH eating plan may also help with weight loss. What are tips for following this plan? General guidelines  Avoid eating more than 2,300 mg (milligrams) of salt (sodium) a day. If you have hypertension, you may need to reduce your sodium intake to 1,500 mg a day.  Limit alcohol intake to no more than 1 drink a day for nonpregnant women and 2 drinks a day for men. One drink equals 12 oz of beer, 5 oz of wine, or 1 oz of hard liquor.  Work with your health care provider to maintain a healthy body weight or to lose weight. Ask what an ideal weight is for you.  Get at least 30 minutes of exercise that causes your heart to beat faster (aerobic exercise) most days of the week. Activities may include walking, swimming, or biking.  Work with your health care provider or diet and nutrition specialist (dietitian) to adjust your eating plan to your individual calorie needs. Reading food labels  Check food labels for the amount of sodium per serving. Choose foods with less than 5 percent of the Daily Value of sodium. Generally, foods with less than 300 mg of sodium per serving fit into this eating plan.  To find whole grains, look for the word "whole" as the first word in the ingredient list. Shopping  Buy products labeled as "low-sodium" or "no salt added."  Buy fresh foods. Avoid canned foods and premade or frozen meals. Cooking  Avoid adding salt when cooking. Use salt-free seasonings or herbs instead of table salt or sea salt.  Check with your health care provider or pharmacist before using salt substitutes.  Do not fry foods. Cook foods using healthy methods such as baking, boiling, grilling, and broiling instead.  Cook with heart-healthy oils, such as olive, canola, soybean, or sunflower oil. Meal planning   Eat a balanced diet that includes: ? 5 or more servings of fruits and vegetables each day. At each meal, try to fill half of your plate with fruits and vegetables. ? Up to 6-8 servings of whole grains each day. ? Less than 6 oz of lean meat, poultry, or fish each day. A 3-oz serving of meat is about the same size as a deck of cards. One egg equals 1 oz. ? 2 servings of low-fat dairy each day. ? A serving of nuts, seeds, or beans 5 times each week. ? Heart-healthy fats. Healthy fats called Omega-3 fatty acids are found in foods such as flaxseeds and coldwater fish, like sardines, salmon, and mackerel.  Limit how much you eat of the following: ? Canned or prepackaged foods. ? Food that is high in trans fat, such as fried foods. ? Food that is high in saturated fat, such as fatty meat. ? Sweets, desserts, sugary drinks, and other foods with added sugar. ? Full-fat dairy products.  Do not salt foods before eating.  Try to eat at least 2 vegetarian meals each week.  Eat more home-cooked food and less restaurant, buffet, and fast food.  When eating at a restaurant,  ask that your food be prepared with less salt or no salt, if possible. What foods are recommended? The items listed may not be a complete list. Talk with your dietitian about what dietary choices are best for you. Grains Whole-grain or whole-wheat bread. Whole-grain or whole-wheat pasta. Brown rice. [place]. Bulgur. Whole-grain and low-sodium cereals. Pita bread. Low-fat, low-sodium crackers. Whole-wheat flour tortillas. Vegetables Fresh or frozen vegetables (raw, steamed, roasted, or grilled). Low-sodium or reduced-sodium tomato and  vegetable juice. Low-sodium or reduced-sodium tomato sauce and tomato paste. Low-sodium or reduced-sodium canned vegetables. Fruits All fresh, dried, or frozen fruit. Canned fruit in natural juice (without added sugar). Meat and other protein foods Skinless chicken or [place]. Ground chicken or [place]. Pork with fat trimmed off. Fish and seafood. Egg whites. Dried beans, peas, or lentils. Unsalted nuts, nut butters, and seeds. Unsalted canned beans. Lean cuts of beef with fat trimmed off. Low-sodium, lean deli meat. Dairy Low-fat (1%) or fat-free (skim) milk. Fat-free, low-fat, or reduced-fat cheeses. Nonfat, low-sodium ricotta or cottage cheese. Low-fat or nonfat yogurt. Low-fat, low-sodium cheese. Fats and oils Soft margarine without trans fats. Vegetable oil. Low-fat, reduced-fat, or light mayonnaise and salad dressings (reduced-sodium). Canola, safflower, olive, soybean, and sunflower oils. Avocado. Seasoning and other foods Herbs. Spices. Seasoning mixes without salt. Unsalted popcorn and pretzels. Fat-free sweets. What foods are not recommended? The items listed may not be a complete list. Talk with your dietitian about what dietary choices are best for you. Grains Baked goods made with fat, such as croissants, muffins, or some breads. Dry pasta or rice meal packs. Vegetables Creamed or fried vegetables. Vegetables in a cheese sauce. Regular canned vegetables (not low-sodium or reduced-sodium). Regular canned tomato sauce and paste (not low-sodium or reduced-sodium). Regular tomato and vegetable juice (not low-sodium or reduced-sodium). Angie Fava. Olives. Fruits Canned fruit in a light or heavy syrup. Fried fruit. Fruit in cream or butter sauce. Meat and other protein foods Fatty cuts of meat. Ribs. Fried meat. Berniece Salines. Sausage. Bologna and other processed lunch meats. Salami. Fatback. Hotdogs. Bratwurst. Salted nuts and seeds. Canned beans with added salt. Canned or smoked fish. Whole eggs or  egg yolks. Chicken or [place] with skin. Dairy Whole or 2% milk, cream, and half-and-half. Whole or full-fat cream cheese. Whole-fat or sweetened yogurt. Full-fat cheese. Nondairy creamers. Whipped toppings. Processed cheese and cheese spreads. Fats and oils Butter. Stick margarine. Lard. Shortening. Ghee. Bacon fat. Tropical oils, such as coconut, palm kernel, or palm oil. Seasoning and other foods Salted popcorn and pretzels. Onion salt, garlic salt, seasoned salt, table salt, and sea salt. Worcestershire sauce. Tartar sauce. Barbecue sauce. Teriyaki sauce. Soy sauce, including reduced-sodium. Steak sauce. Canned and packaged gravies. Fish sauce. Oyster sauce. Cocktail sauce. Horseradish that you find on the shelf. Ketchup. Mustard. Meat flavorings and tenderizers. Bouillon cubes. Hot sauce and Tabasco sauce. Premade or packaged marinades. Premade or packaged taco seasonings. Relishes. Regular salad dressings. Where to find more information:  National Heart, Lung, and [place]: https://wilson-eaton.com/  American Heart Association: www.heart.org Summary  The DASH eating plan is a healthy eating plan that has been shown to reduce high blood pressure (hypertension). It may also reduce your risk for type 2 diabetes, heart disease, and stroke.  With the DASH eating plan, you should limit salt (sodium) intake to 2,300 mg a day. If you have hypertension, you may need to reduce your sodium intake to 1,500 mg a day.  When on the DASH eating plan, aim to eat more fresh  fruits and vegetables, whole grains, lean proteins, low-fat dairy, and heart-healthy fats.  Work with your health care provider or diet and nutrition specialist (dietitian) to adjust your eating plan to your individual calorie needs. This information is not intended to replace advice given to you by your health care provider. Make sure you discuss any questions you have with your health care provider. Document Released: 05/16/2011  Document Revised: 05/20/2016 Document Reviewed: 05/20/2016 Elsevier Interactive Patient Education  [employer]2018 [employer]   Please be sure medication list is accurate. If a new problem present, please set up appointment sooner than planned today.

## 2017-12-05 NOTE — Progress Notes (Signed)
HPI:   Luis Richardson is a 57 y.o. male, who is here today to establish care. Hearing loss,so he has an interpreter with him today.   Former PCP: Dr Dora SimsWitten Last preventive routine visit: > 1 year ago.  Chronic medical problems: Low testosterone, allergic rhinitis, OSA, asthma,HLD, depression, prediabetes, and obesity among some.  Asthma: He uses albuterol inhaler as needed, about 2-3 times per month.  He is also on Flovent 110 mcg 2 puff twice daily.  Symptoms are worse during fall. She is also on Singulair 10 mg daily.  Hypogonadism: Currently he is on testosterone 200 mg IM every 2 weeks. Last testosterone injection on 11/25/2017. Last CBC and LFTs in 04/2017. Tolerating medication well, he denies side effects.  Hypertension, diagnosed about 12 to 13 years ago. Currently he is on chlorthalidone 25 mg daily.  Denies severe/frequent headache,chest pain, dyspnea, palpitation, claudication, focal weakness, or edema.  Echo 04/05/2016: Mild global hypokinesis with LV ejection fraction calculated at 41%  OSA, he is waiting CPAP, last sleep study over a year ago. He feels rested when he first wakes up in the morning. He has not followed up with sleep specialist in over a year.  Depression: Diagnosed about 10 years ago. He denies hospitalization due to psychotic disorders. No suicidal thoughts. Currently he is on Celexa 40 mg daily. Anxiety on hydroxyzine 25 mg 3 times daily as needed.  Insomnia: Currently he is on trazodone 50 mg at bedtime. He sleeps about 8 hours.  Prediabetes: He denies history of diabetes. Currently he is on metformin 500 mg daily. Last hemoglobin A1c in 10/2017 was 5.7.  Concerns today:   Today he is complaining about 2 weeks of clear urine in his throat frequently, postnasal drainage. He has not had fever or chills. No sick contacts or recent travel. Eye pruritus with clear drainage, worse in the morning. Negative for conjunctival  erythema or visual changes. She has history of allergic rhinitis.  Last eye exam 2 to 3 years ago.  Productive and non-productive cough,clear sputum also for 2 weeks.  He denies hemoptysis.  No wheezing or dyspnea. Heartburn "sometimes", he takes Zantac as needed.   Review of Systems  Constitutional: Negative for activity change, appetite change, fatigue and fever.  HENT: Positive for congestion, postnasal drip and rhinorrhea. Negative for nosebleeds, sore throat and trouble swallowing.   Eyes: Negative for redness and visual disturbance.  Respiratory: Positive for cough. Negative for shortness of breath and wheezing.   Cardiovascular: Negative for chest pain, palpitations and leg swelling.  Gastrointestinal: Negative for abdominal pain, nausea and vomiting.       No changes in bowel habits.  Genitourinary: Negative for decreased urine volume, dysuria and hematuria.  Musculoskeletal: Negative for gait problem and myalgias.  Skin: Negative for pallor and rash.  Allergic/Immunologic: Positive for environmental allergies.  Neurological: Negative for syncope, weakness and headaches.  Psychiatric/Behavioral: Positive for sleep disturbance. Negative for confusion. The patient is nervous/anxious.       Current Outpatient Medications on File Prior to Visit  Medication Sig Dispense Refill  . acetaminophen-codeine (TYLENOL #3) 300-30 MG tablet     . albuterol (PROAIR HFA) 108 (90 Base) MCG/ACT inhaler Inhale 2 puffs into the lungs every 4 (four) hours as needed for wheezing or shortness of breath. 1 Inhaler 3  . azelastine (ASTELIN) 0.1 % nasal spray Place 2 sprays into both nostrils 2 (two) times daily. Use in each nostril as directed 30 mL 5  .  cetirizine (ZYRTEC) 10 MG tablet TAKE 1 TABLET BY MOUTH EVERY DAY FOR RUNNY NOSE OR ITCHING 30 tablet 0  . chlorthalidone (HYGROTON) 25 MG tablet TAKE 1 TABLET BY MOUTH EVERY DAY    . citalopram (CELEXA) 40 MG tablet Take 40 mg by mouth daily.      . cyanocobalamin (,VITAMIN B-12,) 1000 MCG/ML injection Inject into the muscle.    Marland Kitchen EPINEPHrine 0.3 mg/0.3 mL IJ SOAJ injection Use as directed for severe allergic reaction 1 Device 1  . fluticasone (FLONASE) 50 MCG/ACT nasal spray USE 2 SPRAYS IN EACH NOSTRIL DAILY FOR STUFFY NOSE OR DRAINAGE 16 g 1  . fluticasone (FLOVENT HFA) 110 MCG/ACT inhaler Inhale 2 puffs into the lungs 2 (two) times daily. 1 Inhaler 5  . glucose blood (ONE TOUCH ULTRA TEST) test strip -TEST ONCE DAILY OR AS PHYSICIAN INSTRUCTED (E119 NIDDM)    . hydrOXYzine (ATARAX/VISTARIL) 25 MG tablet Take 25 mg by mouth 3 (three) times daily as needed.    Marland Kitchen ketotifen (ZADITOR) 0.025 % ophthalmic solution Place 1 drop into both eyes 2 (two) times daily. 10 mL 0  . metFORMIN (GLUCOPHAGE) 500 MG tablet TAKE 1 TABLET BY MOUTH EVERY EVENING FORDIABETES    . montelukast (SINGULAIR) 10 MG tablet Take 1 tablet (10 mg total) by mouth at bedtime. 30 tablet 0  . naproxen sodium (ANAPROX) 220 MG tablet Take 220 mg by mouth.    . NONFORMULARY OR COMPOUNDED ITEM     . Testosterone 20.25 MG/ACT (1.62%) GEL 2 pumps daily apply to skin as directed    . testosterone cypionate (DEPOTESTOSTERONE CYPIONATE) 200 MG/ML injection Inject 200 mg into the muscle.    . traZODone (DESYREL) 50 MG tablet Take 50 mg by mouth at bedtime.     No current facility-administered medications on file prior to visit.      Past Medical History:  Diagnosis Date  . Asthma   . Hypertension   . Urticaria    Allergies  Allergen Reactions  . Grapefruit Extract Other (See Comments)    Blisters all over body  . Lemon Oil Other (See Comments)    Blisters all over body  . Lime, Sulfurated Other (See Comments)    Blisters all over body  . Orange (Diagnostic) Other (See Comments)    Blisters all over body  . Penicillins   . Topiramate Other (See Comments)    Family History  Problem Relation Age of Onset  . Cancer Mother   . Hypertension Mother   . Asthma Father    . Hypertension Father   . Hearing loss Father   . Hypertension Brother   . Angioedema Neg Hx   . Allergic rhinitis Neg Hx   . Eczema Neg Hx   . Immunodeficiency Neg Hx   . Urticaria Neg Hx     Social History   Socioeconomic History  . Marital status: Married    Spouse name: Not on file  . Number of children: Not on file  . Years of education: Not on file  . Highest education level: Not on file  Occupational History  . Not on file  Social Needs  . Financial resource strain: Not on file  . Food insecurity:    Worry: Not on file    Inability: Not on file  . Transportation needs:    Medical: Not on file    Non-medical: Not on file  Tobacco Use  . Smoking status: Never Smoker  . Smokeless tobacco: Current User  Types: Chew  Substance and Sexual Activity  . Alcohol use: Yes    Alcohol/week: 0.0 oz  . Drug use: No  . Sexual activity: Not on file  Lifestyle  . Physical activity:    Days per week: Not on file    Minutes per session: Not on file  . Stress: Not on file  Relationships  . Social connections:    Talks on phone: Not on file    Gets together: Not on file    Attends religious service: Not on file    Active member of club or organization: Not on file    Attends meetings of clubs or organizations: Not on file    Relationship status: Not on file  Other Topics Concern  . Not on file  Social History Narrative  . Not on file    Vitals:   12/05/17 1028  BP: 132/80  Pulse: 66  Resp: 16  Temp: 98.2 F (36.8 C)  SpO2: 97%    Body mass index is 46.59 kg/m.   Physical Exam  Nursing note and vitals reviewed. Constitutional: He is oriented to person, place, and time. He appears well-developed. No distress.  HENT:  Head: Normocephalic and atraumatic.  Right Ear: Decreased hearing is noted.  Left Ear: Decreased hearing is noted.  Nose: Right sinus exhibits no maxillary sinus tenderness and no frontal sinus tenderness. Left sinus exhibits no maxillary  sinus tenderness and no frontal sinus tenderness.  Mouth/Throat: Oropharynx is clear and moist and mucous membranes are normal.  Postnasal drainage.   Eyes: Pupils are equal, round, and reactive to light. Conjunctivae are normal.  Cardiovascular: Normal rate and regular rhythm.  No murmur heard. Pulses:      Dorsalis pedis pulses are 2+ on the right side, and 2+ on the left side.  Respiratory: Effort normal and breath sounds normal. No respiratory distress.  GI: Soft. He exhibits no mass. There is no hepatomegaly. There is no tenderness.  Musculoskeletal: He exhibits edema (Trace pitting edema LE, bilateral.).  Lymphadenopathy:    He has no cervical adenopathy.  Neurological: He is alert and oriented to person, place, and time. He has normal strength. Gait normal.  Skin: Skin is warm. No erythema.  Psychiatric: His mood appears anxious.  Well groomed, good eye contact.      ASSESSMENT AND PLAN:   Mr. Basem was seen today for establish care.  Diagnoses and all orders for this visit:  Lab Results  Component Value Date   ALT 11 12/05/2017   AST 11 12/05/2017   ALKPHOS 83 12/05/2017   BILITOT 0.5 12/05/2017   Lab Results  Component Value Date   WBC 8.9 12/05/2017   HGB 15.1 12/05/2017   HCT 45.7 12/05/2017   MCV 85.0 12/05/2017   PLT 244.0 12/05/2017   Lab Results  Component Value Date   TESTOSTERONE 241.89 (L) 12/05/2017    Allergic rhinitis  This problem may be the cause for postnasal drainage. Saline nasal irrigations as needed. Continue Flonase nasal spray, cetirizine 10 mg daily, and Astelin nasal spray. Eye symptoms suggest allergic conjunctivitis, continue sagittal ophthalmic solution twice daily.  Essential hypertension  Adequately controlled. No changes in current management. DASH diet recommended. Eye exam recommended annually. F/U in 6 months, before if needed.  Male hypogonadism  We discussed some side effects of testosterone  replacement. No changes in current management. Further recommendation will be given according to lab results.  -     Testosterone -  CBC -     Hepatic function panel  Cough  We discussed possible etiologies: Allergies, asthma, GERD among some. OTC plain Mucinex may help. I do not think imaging is needed today, auscultation is negative. Is to get about warning signs. Follow-up as needed.  OSA on CPAP  Weight loss may help. He will continue following with pulmonologist/sleep specialist.  -     Ambulatory referral to Pulmonology  Moderate persistent asthma, unspecified whether complicated  Overall it seems well controlled. Continue albuterol inhaler as needed and Flovent 110 mcg twice daily. Also continue Singulair 10 mg daily. Weight loss will help.  Prediabetes  Educated about the importance of following a healthy diet and regular physical activity. For now he will continue metformin 500 mg daily. Follow-up in 3 to 4 months.  Morbid obesity with BMI of 45.0-49.9, adult (HCC)  We discussed benefits of wt loss as well as adverse effects of obesity. Consistency with healthy diet and physical activity recommended. Daily brisk walking for 15-30 min as tolerated.   Insomnia, unspecified type  Good sleep hygiene also recommended. No changes in trazodone 50 mg at bedtime. Some side effects of medication were discussed as well as the risk for interaction with Celexa. Follow-up in 4 months.  Major depressive disorder in partial remission, unspecified whether recurrent (HCC)   Well-controlled. No changes in Celexa 40 mg. Follow-up in 4 months.      Jared Whorley G. [place], MD  [facility]. [place] office.

## 2017-12-07 DIAGNOSIS — Z9989 Dependence on other enabling machines and devices: Secondary | ICD-10-CM

## 2017-12-07 DIAGNOSIS — G4733 Obstructive sleep apnea (adult) (pediatric): Secondary | ICD-10-CM | POA: Insufficient documentation

## 2017-12-09 ENCOUNTER — Other Ambulatory Visit: Payer: Self-pay | Admitting: *Deleted

## 2017-12-09 MED ORDER — TESTOSTERONE 20.25 MG/ACT (1.62%) TD GEL: TRANSDERMAL | 2 refills | Status: DC

## 2017-12-10 ENCOUNTER — Ambulatory Visit (INDEPENDENT_AMBULATORY_CARE_PROVIDER_SITE_OTHER): Payer: Medicare Other

## 2017-12-10 ENCOUNTER — Ambulatory Visit (INDEPENDENT_AMBULATORY_CARE_PROVIDER_SITE_OTHER): Payer: Medicare Other | Admitting: *Deleted

## 2017-12-10 DIAGNOSIS — E291 Testicular hypofunction: Secondary | ICD-10-CM | POA: Diagnosis not present

## 2017-12-10 DIAGNOSIS — J309 Allergic rhinitis, unspecified: Secondary | ICD-10-CM

## 2017-12-10 DIAGNOSIS — R7989 Other specified abnormal findings of blood chemistry: Secondary | ICD-10-CM | POA: Diagnosis not present

## 2017-12-10 MED ORDER — TESTOSTERONE CYPIONATE 200 MG/ML IM SOLN
200.0000 mg | Freq: Once | INTRAMUSCULAR | Status: AC
  Administered 2017-12-10: 200 mg via INTRAMUSCULAR

## 2017-12-10 NOTE — Patient Instructions (Signed)
Per Dr SwazilandJordan B12 lab will need to be checked prior to continuing B12 injections. Pt chose to wait on getting B12 labs   Pt advised to check with his pharmacy about the Tdap since he has Medicare. I advised him that it may not be covered if done in the office but th pharmacy may cover it.   Testosterone 200mg /41mL injection given today

## 2017-12-17 ENCOUNTER — Other Ambulatory Visit: Payer: Self-pay | Admitting: Allergy and Immunology

## 2017-12-17 ENCOUNTER — Ambulatory Visit (INDEPENDENT_AMBULATORY_CARE_PROVIDER_SITE_OTHER): Payer: Medicare Other | Admitting: *Deleted

## 2017-12-17 DIAGNOSIS — J309 Allergic rhinitis, unspecified: Secondary | ICD-10-CM | POA: Diagnosis not present

## 2017-12-24 ENCOUNTER — Ambulatory Visit (INDEPENDENT_AMBULATORY_CARE_PROVIDER_SITE_OTHER): Payer: Medicare Other | Admitting: *Deleted

## 2017-12-24 ENCOUNTER — Ambulatory Visit (INDEPENDENT_AMBULATORY_CARE_PROVIDER_SITE_OTHER): Payer: Medicare Other

## 2017-12-24 DIAGNOSIS — J309 Allergic rhinitis, unspecified: Secondary | ICD-10-CM

## 2017-12-24 DIAGNOSIS — E291 Testicular hypofunction: Secondary | ICD-10-CM | POA: Diagnosis not present

## 2017-12-24 MED ORDER — TESTOSTERONE CYPIONATE 200 MG/ML IM SOLN
200.0000 mg | Freq: Once | INTRAMUSCULAR | Status: AC
  Administered 2017-12-24: 200 mg via INTRAMUSCULAR

## 2017-12-24 NOTE — Progress Notes (Signed)
Per orders of Dr. [place]ilandJordan, injection of testosterone 200 mg given by Starla Linkarolyn J Cloteal Isaacson. Patient tolerated injection well.

## 2017-12-25 NOTE — Progress Notes (Signed)
Testosterone 200mg /671mL injection given 12/10/17 Per orders of Dr. Betty [place]andJordan.  Patient tolerated injection well.

## 2017-12-31 ENCOUNTER — Ambulatory Visit (INDEPENDENT_AMBULATORY_CARE_PROVIDER_SITE_OTHER): Payer: Medicare Other | Admitting: *Deleted

## 2017-12-31 DIAGNOSIS — J309 Allergic rhinitis, unspecified: Secondary | ICD-10-CM | POA: Diagnosis not present

## 2018-01-07 ENCOUNTER — Ambulatory Visit (INDEPENDENT_AMBULATORY_CARE_PROVIDER_SITE_OTHER): Payer: Medicare Other | Admitting: *Deleted

## 2018-01-07 ENCOUNTER — Ambulatory Visit (INDEPENDENT_AMBULATORY_CARE_PROVIDER_SITE_OTHER): Payer: Medicare Other | Admitting: Family Medicine

## 2018-01-07 DIAGNOSIS — J309 Allergic rhinitis, unspecified: Secondary | ICD-10-CM | POA: Diagnosis not present

## 2018-01-07 DIAGNOSIS — E349 Endocrine disorder, unspecified: Secondary | ICD-10-CM

## 2018-01-07 MED ORDER — TESTOSTERONE CYPIONATE 200 MG/ML IM SOLN
200.0000 mg | Freq: Once | INTRAMUSCULAR | Status: AC
  Administered 2018-01-07: 200 mg via INTRAMUSCULAR

## 2018-01-07 NOTE — Progress Notes (Signed)
Per orders of Dr. Jordan, injection of Testosterone 200 mg given by Norina Cowper ANN. Patient tolerated injection well. 

## 2018-01-09 ENCOUNTER — Telehealth: Payer: Self-pay | Admitting: *Deleted

## 2018-01-09 NOTE — Telephone Encounter (Signed)
Copied from CRM [phone]. Topic: General - Other >> Jan 08, 2018  4:56 PM Mcneil, Ja-Kwan wrote: Reason for CRM: Pt advised that he received 2 calls today 01/08/18 at the mobile # [phone] but that number is text only.

## 2018-01-12 NOTE — Telephone Encounter (Signed)
May have been the automated system, I did not call patient on 01/08/18, I wasn't in the office that day, neither was his PCP. Nothing further at this time.

## 2018-01-21 ENCOUNTER — Encounter: Payer: Self-pay | Admitting: *Deleted

## 2018-01-21 ENCOUNTER — Ambulatory Visit (INDEPENDENT_AMBULATORY_CARE_PROVIDER_SITE_OTHER): Payer: Medicare Other | Admitting: Allergy

## 2018-01-21 ENCOUNTER — Ambulatory Visit (INDEPENDENT_AMBULATORY_CARE_PROVIDER_SITE_OTHER): Payer: Medicare Other

## 2018-01-21 ENCOUNTER — Encounter: Payer: Self-pay | Admitting: Family Medicine

## 2018-01-21 ENCOUNTER — Ambulatory Visit (INDEPENDENT_AMBULATORY_CARE_PROVIDER_SITE_OTHER): Payer: Medicare Other | Admitting: Family Medicine

## 2018-01-21 ENCOUNTER — Ambulatory Visit: Payer: Medicare Other

## 2018-01-21 ENCOUNTER — Encounter: Payer: Self-pay | Admitting: Allergy

## 2018-01-21 VITALS — BP 130/84 | HR 80 | Temp 98.1°F | Resp 16 | Ht 66.75 in | Wt 298.5 lb

## 2018-01-21 VITALS — BP 132/88 | HR 75 | Temp 98.3°F | Resp 18

## 2018-01-21 DIAGNOSIS — E291 Testicular hypofunction: Secondary | ICD-10-CM

## 2018-01-21 DIAGNOSIS — E349 Endocrine disorder, unspecified: Secondary | ICD-10-CM | POA: Diagnosis not present

## 2018-01-21 DIAGNOSIS — J3089 Other allergic rhinitis: Secondary | ICD-10-CM | POA: Diagnosis not present

## 2018-01-21 DIAGNOSIS — N529 Male erectile dysfunction, unspecified: Secondary | ICD-10-CM | POA: Diagnosis not present

## 2018-01-21 DIAGNOSIS — M549 Dorsalgia, unspecified: Secondary | ICD-10-CM | POA: Insufficient documentation

## 2018-01-21 DIAGNOSIS — R809 Proteinuria, unspecified: Secondary | ICD-10-CM

## 2018-01-21 DIAGNOSIS — M545 Low back pain: Secondary | ICD-10-CM

## 2018-01-21 DIAGNOSIS — J4541 Moderate persistent asthma with (acute) exacerbation: Secondary | ICD-10-CM | POA: Diagnosis not present

## 2018-01-21 LAB — POCT URINALYSIS DIPSTICK
Bilirubin, UA: NEGATIVE
Blood, UA: NEGATIVE
Glucose, UA: NEGATIVE
Ketones, UA: NEGATIVE
Leukocytes, UA: NEGATIVE
Nitrite, UA: NEGATIVE
Protein, UA: POSITIVE — AB
Spec Grav, UA: >=1.03 — AB (ref 1.010–1.025)
Urobilinogen, UA: 0.2 U/dL
pH, UA: 5.5 (ref 5.0–8.0)

## 2018-01-21 MED ORDER — SILDENAFIL CITRATE 50 MG PO TABS: 25.0000 mg | ORAL_TABLET | Freq: Every day | ORAL | 0 refills | Status: DC | PRN

## 2018-01-21 MED ORDER — ACETAMINOPHEN-CODEINE #3 300-30 MG PO TABS: 1.0000 | ORAL_TABLET | Freq: Three times a day (TID) | ORAL | 0 refills | Status: AC | PRN

## 2018-01-21 MED ORDER — TESTOSTERONE CYPIONATE 200 MG/ML IM SOLN
200.0000 mg | Freq: Once | INTRAMUSCULAR | Status: AC
  Administered 2018-01-21: 200 mg via INTRAMUSCULAR

## 2018-01-21 NOTE — Assessment & Plan Note (Signed)
Problem seems to be chronic. Today I agree with sure term treatment with Tylenol 3, some side effects discussed.  Explained that because this is a controlled medication is not intended for chronic use.  If pain becomes persistent and/or worse we may need to consider referral to orthopedist for evaluation. Because he has not had imaging done, lumbar x-ray was ordered today. Instructed about warning signs.

## 2018-01-21 NOTE — Assessment & Plan Note (Signed)
We discussed a few treatment options as well as side effects. He will try Viagra 25-50 mg daily as needed. I instructed him to review medication information given at his pharmacy.  He will keep next follow-up appointment.

## 2018-01-21 NOTE — Patient Instructions (Addendum)
Moderate persistent asthma Current exacerbation  Continue montelukast 10 mg daily at bedtime  Continue Qvar 80 g redihaler, 2 inhalations twice daily  During respiratory tract infections or asthma flares, increase Qvar 80 g to 3 inhalations 3 times per day until symptoms have returned to baseline.  Take prednisone pack as directed for next 5 days  have access to albuterol inhaler 2 puffs every 4-6 hours as needed for cough/wheeze/shortness of breath/chest tightness.  May use 15-20 minutes prior to activity.   Monitor frequency of use.    Asthma control goals:   Full participation in all desired activities (may need albuterol before activity)  Albuterol use two time or less a week on average (not counting use with activity)  Cough interfering with sleep two time or less a month  Oral steroids no more than once a year  No hospitalizations   Allergic rhinitis  Continue allergen avoidance measures  Continue montelukast 10 mg daily bedtime.  Continue azelastine nasal spray, 2 sprays per nostril twice daily if needed.  Continue fluticasone nasal spray, 1 spray per nostril twice daily if needed.  Nasal saline spray (i.e., Simply Saline) or nasal saline lavage (i.e., NeilMed) is recommended as needed and prior to medicated nasal sprays.   Return in about 3-4 months, or if symptoms worsen or fail to improve.

## 2018-01-21 NOTE — Patient Instructions (Signed)
A few things to remember from today's visit:   Right low back pain, unspecified chronicity, with sciatica presence unspecified - Plan: acetaminophen-codeine (TYLENOL #3) 300-30 MG tablet  Erectile dysfunction, unspecified erectile dysfunction type - Plan: sildenafil (VIAGRA) 50 MG tablet  Please let your cardiologist know that you are taking Viagra.  Review information you will receive at the pharmacy. Tylenol No. 3 is intended for short time.   Please be sure medication list is accurate. If a new problem present, please set up appointment sooner than planned today.

## 2018-01-21 NOTE — Progress Notes (Signed)
This encounter was created in error - please disregard.

## 2018-01-21 NOTE — Assessment & Plan Note (Signed)
He is on testosterone 200 mg every 2 weeks and stated testosterone level is slightly under normal range. I may consider referring him to urologist. He will keep his next appointment.

## 2018-01-21 NOTE — Progress Notes (Signed)
Per orders of Dr. [place], injection of Testosterone 200 mg given by Aniceto BossNIMMONS, SYLVIA ANN. Patient tolerated injection well.

## 2018-01-21 NOTE — Progress Notes (Signed)
Follow-up Note  RE: Luis Richardson MRN: [id number] DOB: 01-10-1961 Date of Office Visit: 01/21/2018   History of present illness: Luis Richardson is a 57 y.o. male presenting today for follow-up of sick visit.  He was last seen in the office for follow-up on 10/27/17 with Dr. Nunzio Cobbs. He came in today for his regular immunotherapy visit and was found to have labored breathing.  He then mentioned for past week he has been having increased cough with mucus production and wheezing.  He lost his albuterol inhaler (states it fell out of his pocket) about 2-3 months ago and his albuterol for nebulization is very expired. He states when he went to get another albuterol inhaler it was $80 which he was not able to afford.  He does take Qvar 2 puffs twice a day.  He denies any fevers but does state that he has had night sweats.  He has not had any sick exposures that he is aware of.  He does feel that the heat has been a trigger of his current symptoms.  He also does continue on Singulair daily.  He also will use as a lasting and fluticasone as needed for nasal congestion and drainage.  He states he has been using Mucinex with current illness but states that has not been very helpful.  Review of systems: Review of Systems  Constitutional: Positive for diaphoresis. Negative for chills and fever.  HENT: Positive for congestion and hearing loss. Negative for ear discharge, nosebleeds and sore throat.   Eyes: Negative for pain, discharge and redness.  Respiratory: Positive for cough, sputum production, shortness of breath and wheezing. Negative for hemoptysis.   Cardiovascular: Negative for chest pain.  Gastrointestinal: Negative for abdominal pain, constipation, diarrhea, nausea and vomiting.  Musculoskeletal: Negative for joint pain.  Skin: Negative for itching and rash.  Neurological: Negative for headaches.    All other systems negative unless noted above in HPI  Past  medical/social/surgical/family history have been reviewed and are unchanged unless specifically indicated below.  No changes  Medication List: Allergies as of 01/21/2018      Reactions   Grapefruit Extract Other (See Comments)   Blisters all over body Blisters all over body   Lemon Oil Other (See Comments)   Blisters all over body Blisters all over body   Lime, Sulfurated Other (See Comments)   Blisters all over body Blisters all over body   Orange (diagnostic) Other (See Comments)   Blisters all over body   Orange Oil    Blisters all over body   Penicillin G    Syncope, rash   Penicillins    Topiramate Other (See Comments)      Medication List        Accurate as of 01/21/18  5:03 PM. Always use your most recent med list.          acetaminophen-codeine 300-30 MG tablet Commonly known as:  TYLENOL #3 Take 1 tablet by mouth every 8 (eight) hours as needed for up to 5 days for moderate pain.   albuterol 108 (90 Base) MCG/ACT inhaler Commonly known as:  PROVENTIL HFA;VENTOLIN HFA Inhale 2 puffs into the lungs every 4 (four) hours as needed for wheezing or shortness of breath.   azelastine 0.1 % nasal spray Commonly known as:  ASTELIN Place 2 sprays into both nostrils 2 (two) times daily. Use in each nostril as directed   bacitracin-neomycin-polymyxin-hydrocortisone 1 % ointment Commonly known as:  CORTISPORIN Apply topically.  cetirizine 10 MG tablet Commonly known as:  ZYRTEC TAKE 1 TABLET BY MOUTH EVERY DAY FOR RUNNY NOSE OR ITCHING   chlorthalidone 25 MG tablet Commonly known as:  HYGROTON TAKE 1 TABLET BY MOUTH EVERY DAY   citalopram 40 MG tablet Commonly known as:  CELEXA Take 40 mg by mouth daily.   cyanocobalamin 1000 MCG/ML injection Commonly known as:  (VITAMIN B-12) Inject into the muscle.   EPINEPHrine 0.3 mg/0.3 mL Soaj injection Commonly known as:  EPI-PEN Use as directed for severe allergic reaction   fluticasone 110 MCG/ACT  inhaler Commonly known as:  FLOVENT HFA Inhale 2 puffs into the lungs 2 (two) times daily.   fluticasone 50 MCG/ACT nasal spray Commonly known as:  FLONASE USE 2 SPRAYS IN EACH NOSTRIL DAILY FOR STUFFY NOSE OR DRAINAGE   hydrOXYzine 25 MG tablet Commonly known as:  ATARAX/VISTARIL Take 25 mg by mouth 3 (three) times daily as needed.   ketotifen 0.025 % ophthalmic solution Commonly known as:  ZADITOR Place 1 drop into both eyes 2 (two) times daily.   metFORMIN 500 MG tablet Commonly known as:  GLUCOPHAGE TAKE 1 TABLET BY MOUTH EVERY EVENING FORDIABETES   montelukast 10 MG tablet Commonly known as:  SINGULAIR Take 1 tablet (10 mg total) by mouth at bedtime.   NONFORMULARY OR COMPOUNDED ITEM   ONE TOUCH ULTRA TEST test strip Generic drug:  glucose blood -TEST ONCE DAILY OR AS PHYSICIAN INSTRUCTED (E119 NIDDM)   sildenafil 50 MG tablet Commonly known as:  VIAGRA Take 0.5-1 tablets (25-50 mg total) by mouth daily as needed for erectile dysfunction.   Testosterone 20.25 MG/ACT (1.62%) Gel 2 pumps daily apply to skin as directed   testosterone cypionate 200 MG/ML injection Commonly known as:  DEPOTESTOSTERONE CYPIONATE Inject 200 mg into the muscle.   traZODone 50 MG tablet Commonly known as:  DESYREL Take 50 mg by mouth at bedtime.       Known medication allergies: Allergies  Allergen Reactions  . Grapefruit Extract Other (See Comments)    Blisters all over body Blisters all over body  . Lemon Oil Other (See Comments)    Blisters all over body Blisters all over body  . Lime, Sulfurated Other (See Comments)    Blisters all over body Blisters all over body  . Orange (Diagnostic) Other (See Comments)    Blisters all over body  . Orange Oil     Blisters all over body  . Penicillin G     Syncope, rash  . Penicillins   . Topiramate Other (See Comments)     Physical examination: Blood pressure 132/88, pulse 75, temperature 98.3 F (36.8 C), temperature  source Oral, resp. rate 18, SpO2 97 %.  General: Alert, interactive, in no acute distress. HEENT: PERRLA, TMs pearly gray, turbinates mildly edematous without discharge, post-pharynx non erythematous.  Hearing device in place on right ear Neck: Supple without lymphadenopathy. Lungs: Mildly decreased breath sounds bilaterally without wheezing, rhonchi or rales. {no increased work of breathing.  Aeration improved following bronchodilator CV: Normal S1, S2 without murmurs. Abdomen: Nondistended, nontender. Skin: Warm and dry, without lesions or rashes. Extremities:  No clubbing, cyanosis or edema. Neuro:   Grossly intact.  Diagnositics/Labs:  Spirometry: FEV1: 2.43L 75%, FVC: 3.26L 77%   FVC and FEV1 are reduced.   He did not have a significant improvement following.  FEV1 improved to 2.53 or 78% predicted  Assessment and plan:   Moderate persistent asthma Current exacerbation  Continue montelukast 10  mg daily at bedtime  Continue Qvar 80 g redihaler, 2 inhalations twice daily  During respiratory tract infections or asthma flares, increase Qvar 80 g to 3 inhalations 3 times per day until symptoms have returned to baseline.  Take prednisone pack as directed for next 5 days.  He will monitor his blood sugar levels while on steroid  have access to albuterol inhaler 2 puffs every 4-6 hours as needed for cough/wheeze/shortness of breath/chest tightness.  May use 15-20 minutes prior to activity.   Monitor frequency of use.    Asthma control goals:   Full participation in all desired activities (may need albuterol before activity)  Albuterol use two time or less a week on average (not counting use with activity)  Cough interfering with sleep two time or less a month  Oral steroids no more than once a year  No hospitalizations   Allergic rhinitis  Continue allergen avoidance measures  Continue montelukast 10 mg daily bedtime.  Continue azelastine nasal spray, 2 sprays per  nostril twice daily if needed.  Continue fluticasone nasal spray, 1 spray per nostril twice daily if needed.  Nasal saline spray (i.e., Simply Saline) or nasal saline lavage (i.e., NeilMed) is recommended as needed and prior to medicated nasal sprays.   Return in about 3-4 months, or if symptoms worsen or fail to improve.   I appreciate the opportunity to take part in Humza's care. Please do not hesitate to contact me with questions.  Sincerely,   Margo AyeShaylar Clevon Khader, MD Allergy/Immunology [facility]

## 2018-01-21 NOTE — Progress Notes (Signed)
ACUTE VISIT   HPI:  Chief Complaint  Patient presents with  . Back Pain    bilateral lower back pain, right side hurt worse, radiates to abdomen on right side, started Sunday    Luis Richardson is a 57 y.o. male, who is here today complaining of 3 days of severe lower back pain. He has history of deafness, wearing hearing aids.  Today his translator is not here with him, so communication is difficult.   He states that he has had lower back pain for "a while", last time he had back pain was in 08/2017.  According to patient, he has 3-4 episodes per year. No history of recent trauma.  He is requesting a refill on Tylenol 3, which he has taken as needed to help with the pain.  Back pain is shooting/sharp-like, occasionally radiated to right inguinal area.Right side pain moves back and fourth to upper back. He denies lower extremity pain, numbness, tingling, saddle anesthesia, or urine/bowel incontinence.  No skin rash, edema, or erythema on affected area. Pain is constant but exacerbated by movement and alleviated by rest. He took [facility] powder this morning for pain.  It seems to be stable.  He states that he has not had imaging done before.   He is also requesting medication to help with erectile dysfunction. He states that he has trouble with endurance. Problem is more frequent, getting worse.  He denies any lesion or anatomic deformity of external genitalia.  He has never try oral medication.  He is currently on testosterone, which does not seem to be helping. He has history of hypogonadism, he cannot tell me the reason for which testosterone was prescribed in the first placed.  He is currently on testosterone cypionate 200 mg every 2 weeks.  Lab Results  Component Value Date   TESTOSTERONE 241.89 (L) 12/05/2017    Testosterone dose was not changed.  Denies history of CVD.   Review of Systems  Constitutional: Negative for appetite change, chills,  fatigue and fever.  Respiratory: Negative for cough, shortness of breath and wheezing.   Cardiovascular: Negative for leg swelling.  Gastrointestinal: Negative for abdominal pain, nausea and vomiting.       Changes in bowel habits.  Genitourinary: Negative for decreased urine volume, discharge, dysuria, hematuria, penile swelling, testicular pain and urgency.  Musculoskeletal: Positive for back pain. Negative for neck pain.  Skin: Negative for color change and rash.  Neurological: Negative for syncope, weakness, numbness and headaches.  Psychiatric/Behavioral: Negative for confusion. The patient is nervous/anxious.       Current Outpatient Medications on File Prior to Visit  Medication Sig Dispense Refill  . albuterol (PROAIR HFA) 108 (90 Base) MCG/ACT inhaler Inhale 2 puffs into the lungs every 4 (four) hours as needed for wheezing or shortness of breath. 1 Inhaler 3  . azelastine (ASTELIN) 0.1 % nasal spray Place 2 sprays into both nostrils 2 (two) times daily. Use in each nostril as directed 30 mL 5  . bacitracin-neomycin-polymyxin-hydrocortisone (CORTISPORIN) 1 % ointment Apply topically.    . cetirizine (ZYRTEC) 10 MG tablet TAKE 1 TABLET BY MOUTH EVERY DAY FOR RUNNY NOSE OR ITCHING 30 tablet 0  . chlorthalidone (HYGROTON) 25 MG tablet TAKE 1 TABLET BY MOUTH EVERY DAY    . citalopram (CELEXA) 40 MG tablet Take 40 mg by mouth daily.    . cyanocobalamin (,VITAMIN B-12,) 1000 MCG/ML injection Inject into the muscle.    Marland Kitchen EPINEPHrine 0.3 mg/0.3 mL  IJ SOAJ injection Use as directed for severe allergic reaction 1 Device 1  . fluticasone (FLONASE) 50 MCG/ACT nasal spray USE 2 SPRAYS IN EACH NOSTRIL DAILY FOR STUFFY NOSE OR DRAINAGE 18.2 g 3  . fluticasone (FLOVENT HFA) 110 MCG/ACT inhaler Inhale 2 puffs into the lungs 2 (two) times daily. 1 Inhaler 5  . glucose blood (ONE TOUCH ULTRA TEST) test strip -TEST ONCE DAILY OR AS PHYSICIAN INSTRUCTED (E119 NIDDM)    . hydrOXYzine (ATARAX/VISTARIL)  25 MG tablet Take 25 mg by mouth 3 (three) times daily as needed.    Marland Kitchen. ketotifen (ZADITOR) 0.025 % ophthalmic solution Place 1 drop into both eyes 2 (two) times daily. 10 mL 0  . metFORMIN (GLUCOPHAGE) 500 MG tablet TAKE 1 TABLET BY MOUTH EVERY EVENING FORDIABETES    . montelukast (SINGULAIR) 10 MG tablet Take 1 tablet (10 mg total) by mouth at bedtime. 30 tablet 0  . NONFORMULARY OR COMPOUNDED ITEM     . Testosterone 20.25 MG/ACT (1.62%) GEL 2 pumps daily apply to skin as directed 75 g 2  . testosterone cypionate (DEPOTESTOSTERONE CYPIONATE) 200 MG/ML injection Inject 200 mg into the muscle.    . traZODone (DESYREL) 50 MG tablet Take 50 mg by mouth at bedtime.     No current facility-administered medications on file prior to visit.      Past Medical History:  Diagnosis Date  . Asthma   . Hypertension   . Urticaria    Allergies  Allergen Reactions  . Grapefruit Extract Other (See Comments)    Blisters all over body Blisters all over body  . Lemon Oil Other (See Comments)    Blisters all over body Blisters all over body  . Lime, Sulfurated Other (See Comments)    Blisters all over body Blisters all over body  . Orange (Diagnostic) Other (See Comments)    Blisters all over body  . Orange Oil     Blisters all over body  . Penicillin G     Syncope, rash  . Penicillins   . Topiramate Other (See Comments)    Social History   Socioeconomic History  . Marital status: Married    Spouse name: Not on file  . Number of children: Not on file  . Years of education: Not on file  . Highest education level: Not on file  Occupational History  . Not on file  Social Needs  . Financial resource strain: Not on file  . Food insecurity:    Worry: Not on file    Inability: Not on file  . Transportation needs:    Medical: Not on file    Non-medical: Not on file  Tobacco Use  . Smoking status: Never Smoker  . Smokeless tobacco: Current User    Types: Chew  Substance and Sexual  Activity  . Alcohol use: Yes    Alcohol/week: 0.0 standard drinks  . Drug use: No  . Sexual activity: Not on file  Lifestyle  . Physical activity:    Days per week: Not on file    Minutes per session: Not on file  . Stress: Not on file  Relationships  . Social connections:    Talks on phone: Not on file    Gets together: Not on file    Attends religious service: Not on file    Active member of club or organization: Not on file    Attends meetings of clubs or organizations: Not on file    Relationship status: Not  on file  Other Topics Concern  . Not on file  Social History Narrative  . Not on file    Vitals:   01/21/18 1146  BP: 130/84  Pulse: 80  Resp: 16  Temp: 98.1 F (36.7 C)  SpO2: 96%   Body mass index is 47.1 kg/m.   Physical Exam  Nursing note and vitals reviewed. Constitutional: He is oriented to person, place, and time. He appears well-developed. No distress.  HENT:  Head: Normocephalic and atraumatic.  Right Ear: Decreased hearing is noted.  Left Ear: Decreased hearing is noted.  Mouth/Throat: Mucous membranes are normal.  Eyes: Conjunctivae are normal.  Cardiovascular: Normal rate and regular rhythm.  No murmur heard. Respiratory: Effort normal and breath sounds normal. No respiratory distress.  GI: Soft. There is no tenderness.  Genitourinary:  Genitourinary Comments: Refused,no concerns.  Musculoskeletal: He exhibits no edema.       Lumbar back: He exhibits tenderness. He exhibits no bony tenderness.       Back:  No significant deformity appreciated. There is tenderness upon palpation of paraspinal muscles right lumbar. Pain elicited with movement on exam table during examination. No local edema or erythema appreciated, no suspicious lesions.   Neurological: He is alert and oriented to person, place, and time. He has normal strength. Coordination normal.  Reflex Scores:      Patellar reflexes are 2+ on the right side and 2+ on the left  side. He can work on tiptoes and heels.  Skin: Skin is warm. No erythema.  Psychiatric: He has a normal mood and affect.  Well groomed,good eye contact.    ASSESSMENT AND PLAN:   Luis Richardson was seen today for back pain.  Orders Placed This Encounter  Procedures  . DG Lumbar Spine Complete    Standing Status:   Future    Number of Occurrences:   1    Standing Expiration Date:   03/24/2019    Order Specific Question:   Reason for Exam (SYMPTOM  OR DIAGNOSIS REQUIRED)    Answer:   Intermittent lower back pain for a while, no history of trauma.  Occasionally radiated to right groin.  According to patient he has not had imaging done before.    Order Specific Question:   Preferred imaging location?    Answer:   Internal  . POC Urinalysis Dipstick      Erectile dysfunction We discussed a few treatment options as well as side effects. He will try Viagra 25-50 mg daily as needed. I instructed him to review medication information given at his pharmacy.  He will keep next follow-up appointment.  Back pain Problem seems to be chronic. Today I agree with sure term treatment with Tylenol 3, some side effects discussed.  Explained that because this is a controlled medication is not intended for chronic use.  If pain becomes persistent and/or worse we may need to consider referral to orthopedist for evaluation. Because he has not had imaging done, lumbar x-ray was ordered today. Instructed about warning signs.   Male hypogonadism He is on testosterone 200 mg every 2 weeks and stated testosterone level is slightly under normal range. I may consider referring him to urologist. He will keep his next appointment.   After visit I noted that he has testosterone cypionate and testosterone gel on his med list. We will reach the patient to go through his medication list, he should not be on both, if he is urology referral will be placed given the  fact his testosterone level is still  low.  Proteinuria, unspecified type  Urine dipstick was done today, otherwise negative except for positive protein in the urine. We will repeat urine next visit.    Return if symptoms worsen or fail to improve, for Keep next appt.     Betty G. [place], MD  [facility]. [place] office.

## 2018-01-28 ENCOUNTER — Other Ambulatory Visit: Payer: Self-pay | Admitting: *Deleted

## 2018-01-28 ENCOUNTER — Ambulatory Visit (INDEPENDENT_AMBULATORY_CARE_PROVIDER_SITE_OTHER): Payer: Medicare Other | Admitting: *Deleted

## 2018-01-28 DIAGNOSIS — J309 Allergic rhinitis, unspecified: Secondary | ICD-10-CM

## 2018-02-04 ENCOUNTER — Ambulatory Visit (INDEPENDENT_AMBULATORY_CARE_PROVIDER_SITE_OTHER): Payer: Medicare Other | Admitting: Family Medicine

## 2018-02-04 ENCOUNTER — Ambulatory Visit (INDEPENDENT_AMBULATORY_CARE_PROVIDER_SITE_OTHER): Payer: Medicare Other

## 2018-02-04 ENCOUNTER — Ambulatory Visit: Payer: Medicare Other | Admitting: Family Medicine

## 2018-02-04 DIAGNOSIS — J309 Allergic rhinitis, unspecified: Secondary | ICD-10-CM | POA: Diagnosis not present

## 2018-02-04 DIAGNOSIS — E349 Endocrine disorder, unspecified: Secondary | ICD-10-CM | POA: Diagnosis not present

## 2018-02-04 MED ORDER — TESTOSTERONE CYPIONATE 200 MG/ML IM SOLN
200.0000 mg | Freq: Once | INTRAMUSCULAR | Status: AC
  Administered 2018-02-04: 200 mg via INTRAMUSCULAR

## 2018-02-04 NOTE — Progress Notes (Signed)
Per orders of Dr. SwazilandJordan, injection of Testosterone 200 mg given by Aniceto BossNIMMONS, Jashun Puertas ANN. Patient tolerated injection well.

## 2018-02-11 ENCOUNTER — Ambulatory Visit (INDEPENDENT_AMBULATORY_CARE_PROVIDER_SITE_OTHER): Payer: Medicare Other

## 2018-02-11 DIAGNOSIS — J309 Allergic rhinitis, unspecified: Secondary | ICD-10-CM

## 2018-02-16 ENCOUNTER — Ambulatory Visit: Payer: Self-pay | Admitting: *Deleted

## 2018-02-16 NOTE — Telephone Encounter (Signed)
Pt calling with sign language interrupter present. Reports took one Viagra for the first time Friday. States became SOB and lightheaded. Also states "Did not work."  Presently reports remains SOB but "Much better than Friday." States "Slight." Initially stated at beginning of NT call he was still lightheaded then retracted during further assessment; "Dizziness is gone."  States SOB is intermittent, able to lie flat at night, has CPAP. H/O asthma. Denies wheezing. Reports mild non-productive cough at times. Has used inhalers. Reports "no energy." Pt is evasive historian. Appt made for tomorrow with C. Nafziger. Care advise given per protocol. Instructed to go to ED if SOB worsens, any CP.  Requesting Sign Language interrupter to be present at appt..  Reason for Disposition . [1] MODERATE longstanding difficulty breathing (e.g., speaks in phrases, SOB even at rest, pulse 100-120) AND [2] SAME as normal    H/O asthma. Made appt for within 24 hrs.  Answer Assessment - Initial Assessment Questions 1. RESPIRATORY STATUS: "Describe your breathing?" (e.g., wheezing, shortness of breath, unable to speak, severe coughing)      Mild, :Better since Friday, "Tight trying to breath" states "Slight" 2. ONSET: "When did this breathing problem begin?"      Friday after taking Viagra 3. PATTERN "Does the difficult breathing come and go, or has it been constant since it started?"      Intermittent 4. SEVERITY: "How bad is your breathing?" (e.g., mild, moderate, severe)    - MILD: No SOB at rest, mild SOB with walking, speaks normally in sentences, can lay down, no retractions, pulse < 100.    - MODERATE: SOB at rest, SOB with minimal exertion and prefers to sit, cannot lie down flat, speaks in phrases, mild retractions, audible wheezing, pulse 100-120.    - SEVERE: Very SOB at rest, speaks in single words, struggling to breathe, sitting hunched forward, retractions, pulse > 120      Sleeps with CPAP, no positional  changes, can sleep flat. 5. RECURRENT SYMPTOM: "Have you had difficulty breathing before?" If so, ask: "When was the last time?" and "What happened that time?"     H/O asthma, usesnhalers 6. CARDIAC HISTORY: "Do you have any history of heart disease?" (e.g., heart attack, angina, bypass surgery, angioplasty)     HTN 7. LUNG HISTORY: "Do you have any history of lung disease?"  (e.g., pulmonary embolus, asthma, emphysema)     Asthma 8. CAUSE: "What do you think is causing the breathing problem?"      Viagra 9. OTHER SYMPTOMS: "Do you have any other symptoms? (e.g., dizziness, runny nose, cough, chest pain, fever) Lightheaded Friday, not presently  Protocols used: BREATHING DIFFICULTY-A-AH

## 2018-02-16 NOTE — Telephone Encounter (Signed)
Cory - FYI. Thanks 

## 2018-02-17 ENCOUNTER — Telehealth: Payer: Self-pay | Admitting: Family Medicine

## 2018-02-17 ENCOUNTER — Ambulatory Visit (INDEPENDENT_AMBULATORY_CARE_PROVIDER_SITE_OTHER): Payer: Medicare Other

## 2018-02-17 ENCOUNTER — Ambulatory Visit (INDEPENDENT_AMBULATORY_CARE_PROVIDER_SITE_OTHER): Payer: Medicare Other | Admitting: Adult Health

## 2018-02-17 ENCOUNTER — Encounter: Payer: Self-pay | Admitting: Adult Health

## 2018-02-17 VITALS — BP 156/100 | HR 77 | Temp 98.4°F | Wt 305.0 lb

## 2018-02-17 DIAGNOSIS — R0602 Shortness of breath: Secondary | ICD-10-CM

## 2018-02-17 NOTE — Progress Notes (Signed)
Subjective:    Patient ID: Luis Richardson, male    DOB: March 25, 1961, 57 y.o.   MRN: [id number]  HPI 57 year old male who  has a past medical history of Asthma, Hypertension, and Urticaria. He is a patient of Dr. Swaziland who I am seeing today for an acute issue of shortness of breath that started Friday after taking his first dose of Viagra. He also reports having a headache and nasal congestion.   He has been using his inhalers and nebulizer at home; he reports that his shortness of breath has improved but continues to feel wheezy.    Review of Systems See HPI   Past Medical History:  Diagnosis Date  . Asthma   . Hypertension   . Urticaria     Social History   Socioeconomic History  . Marital status: Married    Spouse name: Not on file  . Number of children: Not on file  . Years of education: Not on file  . Highest education level: Not on file  Occupational History  . Not on file  Social Needs  . Financial resource strain: Not on file  . Food insecurity:    Worry: Not on file    Inability: Not on file  . Transportation needs:    Medical: Not on file    Non-medical: Not on file  Tobacco Use  . Smoking status: Never Smoker  . Smokeless tobacco: Current User    Types: Chew  Substance and Sexual Activity  . Alcohol use: Yes    Alcohol/week: 0.0 standard drinks  . Drug use: No  . Sexual activity: Not on file  Lifestyle  . Physical activity:    Days per week: Not on file    Minutes per session: Not on file  . Stress: Not on file  Relationships  . Social connections:    Talks on phone: Not on file    Gets together: Not on file    Attends religious service: Not on file    Active member of club or organization: Not on file    Attends meetings of clubs or organizations: Not on file    Relationship status: Not on file  . Intimate partner violence:    Fear of current or ex partner: Not on file    Emotionally abused: Not on file    Physically abused: Not on file     Forced sexual activity: Not on file  Other Topics Concern  . Not on file  Social History Narrative  . Not on file    Past Surgical History:  Procedure Laterality Date  . ADENOIDECTOMY    . HERNIA REPAIR    . TESTICLE SURGERY    . TONSILLECTOMY      Family History  Problem Relation Age of Onset  . Cancer Mother   . Hypertension Mother   . Asthma Father   . Hypertension Father   . Hearing loss Father   . Hypertension Brother   . Angioedema Neg Hx   . Allergic rhinitis Neg Hx   . Eczema Neg Hx   . Immunodeficiency Neg Hx   . Urticaria Neg Hx     Allergies  Allergen Reactions  . Grapefruit Extract Other (See Comments)    Blisters all over body Blisters all over body  . Lemon Oil Other (See Comments)    Blisters all over body Blisters all over body  . Lime, Sulfurated Other (See Comments)    Blisters all over body  Blisters all over body  . Orange (Diagnostic) Other (See Comments)    Blisters all over body  . Orange Oil     Blisters all over body  . Penicillin G     Syncope, rash  . Penicillins   . Topiramate Other (See Comments)    Current Outpatient Medications on File Prior to Visit  Medication Sig Dispense Refill  . albuterol (PROAIR HFA) 108 (90 Base) MCG/ACT inhaler Inhale 2 puffs into the lungs every 4 (four) hours as needed for wheezing or shortness of breath. 1 Inhaler 3  . azelastine (ASTELIN) 0.1 % nasal spray Place 2 sprays into both nostrils 2 (two) times daily. Use in each nostril as directed 30 mL 5  . bacitracin-neomycin-polymyxin-hydrocortisone (CORTISPORIN) 1 % ointment Apply topically.    . cetirizine (ZYRTEC) 10 MG tablet TAKE 1 TABLET BY MOUTH EVERY DAY FOR RUNNY NOSE OR ITCHING 30 tablet 0  . chlorthalidone (HYGROTON) 25 MG tablet TAKE 1 TABLET BY MOUTH EVERY DAY    . citalopram (CELEXA) 40 MG tablet Take 40 mg by mouth daily.    . cyanocobalamin (,VITAMIN B-12,) 1000 MCG/ML injection Inject into the muscle.    Marland Kitchen EPINEPHrine 0.3 mg/0.3  mL IJ SOAJ injection Use as directed for severe allergic reaction 1 Device 1  . fluticasone (FLONASE) 50 MCG/ACT nasal spray USE 2 SPRAYS IN EACH NOSTRIL DAILY FOR STUFFY NOSE OR DRAINAGE 18.2 g 3  . fluticasone (FLOVENT HFA) 110 MCG/ACT inhaler Inhale 2 puffs into the lungs 2 (two) times daily. 1 Inhaler 5  . glucose blood (ONE TOUCH ULTRA TEST) test strip -TEST ONCE DAILY OR AS PHYSICIAN INSTRUCTED (E119 NIDDM)    . hydrOXYzine (ATARAX/VISTARIL) 25 MG tablet Take 25 mg by mouth 3 (three) times daily as needed.    Marland Kitchen ketotifen (ZADITOR) 0.025 % ophthalmic solution Place 1 drop into both eyes 2 (two) times daily. 10 mL 0  . metFORMIN (GLUCOPHAGE) 500 MG tablet TAKE 1 TABLET BY MOUTH EVERY EVENING FORDIABETES    . montelukast (SINGULAIR) 10 MG tablet Take 1 tablet (10 mg total) by mouth at bedtime. 30 tablet 0  . NONFORMULARY OR COMPOUNDED ITEM     . sildenafil (VIAGRA) 50 MG tablet Take 0.5-1 tablets (25-50 mg total) by mouth daily as needed for erectile dysfunction. 10 tablet 0  . testosterone cypionate (DEPOTESTOSTERONE CYPIONATE) 200 MG/ML injection Inject 200 mg into the muscle.    . traZODone (DESYREL) 50 MG tablet Take 50 mg by mouth at bedtime.     No current facility-administered medications on file prior to visit.     BP (!) 156/100   Pulse 77   Temp 98.4 F (36.9 C)   Wt (!) 305 lb (138.3 kg)   SpO2 98%   BMI 48.13 kg/m       Objective:   Physical Exam  Constitutional: He is oriented to person, place, and time. He appears well-developed and well-nourished. No distress.  Cardiovascular: Normal rate, regular rhythm, normal heart sounds and intact distal pulses.  Pulmonary/Chest: Effort normal. He has wheezes.  Neurological: He is alert and oriented to person, place, and time.  Skin: Skin is warm and dry. He is not diaphoretic.  Psychiatric: He has a normal mood and affect. His behavior is normal. Judgment and thought content normal.  Nursing note and vitals reviewed.       Assessment & Plan:  1. Shortness of breath - doubt from Viagra. Will get chest xray  - DG Chest 2 View;  Future - He as advised that the headache and nasal congestion are common side effects of viagra.   Shirline Frees, NP

## 2018-02-17 NOTE — Telephone Encounter (Signed)
Copied from CRM [id number]. Topic: Quick Communication - See Telephone Encounter >> Feb 17, 2018  5:57 PM Raquel Sarna wrote: Caryn Bee -  Sign Language Interpretor  [phone] Burnett Harry will the sign language interpretor for Harlan's appt at 10:30 am on Wed.

## 2018-02-18 ENCOUNTER — Ambulatory Visit (INDEPENDENT_AMBULATORY_CARE_PROVIDER_SITE_OTHER): Payer: Medicare Other | Admitting: Adult Health

## 2018-02-18 ENCOUNTER — Encounter: Payer: Self-pay | Admitting: *Deleted

## 2018-02-18 ENCOUNTER — Encounter: Payer: Self-pay | Admitting: Adult Health

## 2018-02-18 ENCOUNTER — Ambulatory Visit (INDEPENDENT_AMBULATORY_CARE_PROVIDER_SITE_OTHER): Payer: Medicare Other | Admitting: Family Medicine

## 2018-02-18 VITALS — BP 138/90 | Temp 99.1°F | Wt 302.0 lb

## 2018-02-18 DIAGNOSIS — R9389 Abnormal findings on diagnostic imaging of other specified body structures: Secondary | ICD-10-CM | POA: Diagnosis not present

## 2018-02-18 DIAGNOSIS — E349 Endocrine disorder, unspecified: Secondary | ICD-10-CM

## 2018-02-18 MED ORDER — PREDNISONE 20 MG PO TABS: 20.0000 mg | ORAL_TABLET | Freq: Every day | ORAL | 0 refills | Status: DC

## 2018-02-18 MED ORDER — TESTOSTERONE CYPIONATE 200 MG/ML IM SOLN
200.0000 mg | Freq: Once | INTRAMUSCULAR | Status: AC
  Administered 2018-02-18: 200 mg via INTRAMUSCULAR

## 2018-02-18 NOTE — Progress Notes (Signed)
Subjective:    Patient ID: Luis Richardson, male    DOB: Jul 02, 1960, 56 y.o.   MRN: [id number]  HPI 57 year old male who  has a past medical history of Asthma, Hypertension, and Urticaria. He presents to the office today with the sign language interpreter for follow up after chest xray yesterday for SOB and wheezing that started 5 days ago after taking the first dose of Viagra.   Chest xray showed : Coarse interstitial opacities throughout both lungs, a nonspecific finding whose chronicity is unknown without prior examinations. This could indicate chronic interstitial lung disease or could indicate an acute drug reaction.  Today in the office he reports that he continues to use his inhaler but does not notice much improvement over the last 24 hours. He still feels slightly short of breath and notices a wheezing sensation when he speaks.   He does not have a smoking history   Review of Systems See HPI   Past Medical History:  Diagnosis Date  . Asthma   . Hypertension   . Urticaria     Social History   Socioeconomic History  . Marital status: Married    Spouse name: Not on file  . Number of children: Not on file  . Years of education: Not on file  . Highest education level: Not on file  Occupational History  . Not on file  Social Needs  . Financial resource strain: Not on file  . Food insecurity:    Worry: Not on file    Inability: Not on file  . Transportation needs:    Medical: Not on file    Non-medical: Not on file  Tobacco Use  . Smoking status: Never Smoker  . Smokeless tobacco: Current User    Types: Chew  Substance and Sexual Activity  . Alcohol use: Yes    Alcohol/week: 0.0 standard drinks  . Drug use: No  . Sexual activity: Not on file  Lifestyle  . Physical activity:    Days per week: Not on file    Minutes per session: Not on file  . Stress: Not on file  Relationships  . Social connections:    Talks on phone: Not on file    Gets together: Not  on file    Attends religious service: Not on file    Active member of club or organization: Not on file    Attends meetings of clubs or organizations: Not on file    Relationship status: Not on file  . Intimate partner violence:    Fear of current or ex partner: Not on file    Emotionally abused: Not on file    Physically abused: Not on file    Forced sexual activity: Not on file  Other Topics Concern  . Not on file  Social History Narrative  . Not on file    Past Surgical History:  Procedure Laterality Date  . ADENOIDECTOMY    . HERNIA REPAIR    . TESTICLE SURGERY    . TONSILLECTOMY      Family History  Problem Relation Age of Onset  . Cancer Mother   . Hypertension Mother   . Asthma Father   . Hypertension Father   . Hearing loss Father   . Hypertension Brother   . Angioedema Neg Hx   . Allergic rhinitis Neg Hx   . Eczema Neg Hx   . Immunodeficiency Neg Hx   . Urticaria Neg Hx     Allergies  Allergen Reactions  . Grapefruit Extract Other (See Comments)    Blisters all over body Blisters all over body  . Lemon Oil Other (See Comments)    Blisters all over body Blisters all over body  . Lime, Sulfurated Other (See Comments)    Blisters all over body Blisters all over body  . Orange (Diagnostic) Other (See Comments)    Blisters all over body  . Orange Oil     Blisters all over body  . Penicillin G     Syncope, rash  . Penicillins   . Topiramate Other (See Comments)    Current Outpatient Medications on File Prior to Visit  Medication Sig Dispense Refill  . albuterol (PROAIR HFA) 108 (90 Base) MCG/ACT inhaler Inhale 2 puffs into the lungs every 4 (four) hours as needed for wheezing or shortness of breath. 1 Inhaler 3  . azelastine (ASTELIN) 0.1 % nasal spray Place 2 sprays into both nostrils 2 (two) times daily. Use in each nostril as directed 30 mL 5  . bacitracin-neomycin-polymyxin-hydrocortisone (CORTISPORIN) 1 % ointment Apply topically.    .  cetirizine (ZYRTEC) 10 MG tablet TAKE 1 TABLET BY MOUTH EVERY DAY FOR RUNNY NOSE OR ITCHING 30 tablet 0  . chlorthalidone (HYGROTON) 25 MG tablet TAKE 1 TABLET BY MOUTH EVERY DAY    . citalopram (CELEXA) 40 MG tablet Take 40 mg by mouth daily.    . cyanocobalamin (,VITAMIN B-12,) 1000 MCG/ML injection Inject into the muscle.    Marland Kitchen EPINEPHrine 0.3 mg/0.3 mL IJ SOAJ injection Use as directed for severe allergic reaction 1 Device 1  . fluticasone (FLONASE) 50 MCG/ACT nasal spray USE 2 SPRAYS IN EACH NOSTRIL DAILY FOR STUFFY NOSE OR DRAINAGE 18.2 g 3  . fluticasone (FLOVENT HFA) 110 MCG/ACT inhaler Inhale 2 puffs into the lungs 2 (two) times daily. 1 Inhaler 5  . glucose blood (ONE TOUCH ULTRA TEST) test strip -TEST ONCE DAILY OR AS PHYSICIAN INSTRUCTED (E119 NIDDM)    . hydrOXYzine (ATARAX/VISTARIL) 25 MG tablet Take 25 mg by mouth 3 (three) times daily as needed.    Marland Kitchen ketotifen (ZADITOR) 0.025 % ophthalmic solution Place 1 drop into both eyes 2 (two) times daily. 10 mL 0  . metFORMIN (GLUCOPHAGE) 500 MG tablet TAKE 1 TABLET BY MOUTH EVERY EVENING FORDIABETES    . montelukast (SINGULAIR) 10 MG tablet Take 1 tablet (10 mg total) by mouth at bedtime. 30 tablet 0  . NONFORMULARY OR COMPOUNDED ITEM     . testosterone cypionate (DEPOTESTOSTERONE CYPIONATE) 200 MG/ML injection Inject 200 mg into the muscle.    . traZODone (DESYREL) 50 MG tablet Take 50 mg by mouth at bedtime.    . sildenafil (VIAGRA) 50 MG tablet Take 0.5-1 tablets (25-50 mg total) by mouth daily as needed for erectile dysfunction. (Patient not taking: Reported on 02/18/2018) 10 tablet 0   No current facility-administered medications on file prior to visit.     BP 138/90   Temp 99.1 F (37.3 C)   Wt (!) 302 lb (137 kg)   BMI 47.65 kg/m       Objective:   Physical Exam  Constitutional: He appears well-developed and well-nourished. No distress.  Cardiovascular: Normal rate, regular rhythm, normal heart sounds and intact distal  pulses.  Pulmonary/Chest: Effort normal. He has decreased breath sounds in the right middle field, the left middle field and the left lower field. He has wheezes (trace in upper lung fields ).  Skin: He is not diaphoretic.  Vitals  reviewed.     Assessment & Plan:  1. Abnormal chest x-ray - Will refer to pulmonary. He would like to see if prednisone will help. Advised that I doubt that it would help with this but I would be willing to give it a try. He was advised to follow up with Dr. Swaziland as needed - Ambulatory referral to Pulmonology - predniSONE (DELTASONE) 20 MG tablet; Take 1 tablet (20 mg total) by mouth daily with breakfast.  Dispense: 7 tablet; Refill: 0  Shirline Frees, NP

## 2018-02-18 NOTE — Telephone Encounter (Signed)
Noted  

## 2018-02-18 NOTE — Progress Notes (Signed)
Vials made. Exp: 02-19-19. hv 

## 2018-02-18 NOTE — Progress Notes (Cosign Needed)
Per orders of Dr. Swaziland, injection of testosterone given by Raj Janus. Patient tolerated injection well.

## 2018-02-20 DIAGNOSIS — J301 Allergic rhinitis due to pollen: Secondary | ICD-10-CM | POA: Diagnosis not present

## 2018-02-23 DIAGNOSIS — J3089 Other allergic rhinitis: Secondary | ICD-10-CM | POA: Diagnosis not present

## 2018-03-04 ENCOUNTER — Ambulatory Visit: Payer: Medicare Other

## 2018-03-05 ENCOUNTER — Ambulatory Visit (INDEPENDENT_AMBULATORY_CARE_PROVIDER_SITE_OTHER): Payer: Medicare Other

## 2018-03-05 DIAGNOSIS — Z23 Encounter for immunization: Secondary | ICD-10-CM

## 2018-03-05 DIAGNOSIS — E349 Endocrine disorder, unspecified: Secondary | ICD-10-CM | POA: Diagnosis not present

## 2018-03-05 MED ORDER — TESTOSTERONE CYPIONATE 100 MG/ML IM SOLN
200.0000 mg | INTRAMUSCULAR | Status: DC
  Administered 2018-03-05: 200 mg via INTRAMUSCULAR

## 2018-03-05 NOTE — Progress Notes (Signed)
Per orders of Dr.Jordan, injection of Testosterone and Flu given by Sherrin Daisy. Patient tolerated injection well.

## 2018-03-10 ENCOUNTER — Encounter: Payer: Self-pay | Admitting: Internal Medicine

## 2018-03-10 ENCOUNTER — Ambulatory Visit (INDEPENDENT_AMBULATORY_CARE_PROVIDER_SITE_OTHER): Payer: Medicare Other | Admitting: Internal Medicine

## 2018-03-10 ENCOUNTER — Other Ambulatory Visit (INDEPENDENT_AMBULATORY_CARE_PROVIDER_SITE_OTHER): Payer: Medicare Other

## 2018-03-10 ENCOUNTER — Ambulatory Visit (INDEPENDENT_AMBULATORY_CARE_PROVIDER_SITE_OTHER): Payer: Medicare Other | Admitting: *Deleted

## 2018-03-10 VITALS — BP 140/90 | HR 71 | Ht 66.75 in | Wt 297.5 lb

## 2018-03-10 DIAGNOSIS — J8410 Pulmonary fibrosis, unspecified: Secondary | ICD-10-CM | POA: Diagnosis not present

## 2018-03-10 DIAGNOSIS — R0609 Other forms of dyspnea: Secondary | ICD-10-CM

## 2018-03-10 DIAGNOSIS — Z6841 Body Mass Index (BMI) 40.0 and over, adult: Secondary | ICD-10-CM

## 2018-03-10 DIAGNOSIS — J309 Allergic rhinitis, unspecified: Secondary | ICD-10-CM | POA: Diagnosis not present

## 2018-03-10 LAB — CBC WITH DIFFERENTIAL/PLATELET
BASOS PCT: 1 % (ref 0.0–3.0)
Basophils Absolute: 0.1 10*3/uL (ref 0.0–0.1)
EOS PCT: 5 % (ref 0.0–5.0)
Eosinophils Absolute: 0.5 10*3/uL (ref 0.0–0.7)
HCT: 44.4 % (ref 39.0–52.0)
Hemoglobin: 14.5 g/dL (ref 13.0–17.0)
Lymphocytes Relative: 19.4 % (ref 12.0–46.0)
Lymphs Abs: 1.9 10*3/uL (ref 0.7–4.0)
MCHC: 32.7 g/dL (ref 30.0–36.0)
MCV: 82.8 fl (ref 78.0–100.0)
MONO ABS: 0.9 10*3/uL (ref 0.1–1.0)
MONOS PCT: 8.6 % (ref 3.0–12.0)
Neutro Abs: 6.5 10*3/uL (ref 1.4–7.7)
Neutrophils Relative %: 66 % (ref 43.0–77.0)
Platelets: 235 10*3/uL (ref 150.0–400.0)
RBC: 5.36 Mil/uL (ref 4.22–5.81)
RDW: 15.7 % — AB (ref 11.5–15.5)
WBC: 9.9 10*3/uL (ref 4.0–10.5)

## 2018-03-10 LAB — BASIC METABOLIC PANEL
BUN: 13 mg/dL (ref 6–23)
CALCIUM: 9.2 mg/dL (ref 8.4–10.5)
CO2: 31 meq/L (ref 19–32)
CREATININE: 1.26 mg/dL (ref 0.40–1.50)
Chloride: 101 mEq/L (ref 96–112)
GFR: 62.57 mL/min (ref >60.00)
Glucose, Bld: 108 mg/dL — ABNORMAL HIGH (ref 70–99)
Potassium: 3.9 mEq/L (ref 3.5–5.1)
SODIUM: 140 meq/L (ref 135–145)

## 2018-03-10 LAB — SEDIMENTATION RATE: Sed Rate: 13 mm/hr (ref 0–20)

## 2018-03-10 LAB — TSH: TSH: 1.26 u[IU]/mL (ref 0.35–4.50)

## 2018-03-10 LAB — BRAIN NATRIURETIC PEPTIDE: Pro B Natriuretic peptide (BNP): 307 pg/mL — ABNORMAL HIGH (ref 0.0–100.0)

## 2018-03-10 NOTE — Assessment & Plan Note (Signed)
Onset doe 2014  Worse sob since summer 2019  Spirometry  01/21/18 FVC 3.10 (73%)   - cxr 02/17/18 suggestive ILD  03/10/2018  Walked RA x 3 laps @ 185 ft each stopped due to  End of study, nl pace, no sob but sats 93% at end    DDx for pulmonary fibrosis  includes idiopathic pulmonary fibrosis, pulmonary fibrosis associated with rheumatologic diseases (which have a relatively benign course in most cases) , adverse effect from  drugs such as chemotherapy or amiodarone exposure, nonspecific interstitial pneumonia which is typically steroid responsive, and chronic hypersensitivity pneumonitis.   In active  smokers Langerhan's Cell  Histiocyctosis (eosinophilic granuomatosis),  DIP,  and Respiratory Bronchiolitis ILD also need to be considered,  But can be dismissed here as never smoker.  Most likely this is IPF so needs HRCT to define it and full pfts to quantitate and then low threshold to start antifibrotic rx in the setting of apparent progression of symptoms over the last year that are not easily attributable  to another dx so sent for labs to r/o chf/ collagen vasc dz an return in 4 weeks to complete the w/u.

## 2018-03-10 NOTE — Assessment & Plan Note (Addendum)
Body mass index is 46.94 kg/m.  -  trending up  Lab Results  Component Value Date   TSH 1.26 03/10/2018     Contributing to gerd risk/ doe/reviewed the need and the process to achieve and maintain neg calorie balance > defer f/u primary care including intermittently monitoring thyroid status       Total time devoted to counseling  > 50 % of initial 60 min office visit:  review case with pt via sign language interpretor  discussion of options/alternatives/ personally creating written customized instructions  in presence of pt  then going over those specific  Instructions directly with the pt including how to use all of the meds but in particular covering each new medication in detail and the difference between the maintenance= "automatic" meds and the prns using an action plan format for the latter (If this problem/symptom => do that organization reading Left to right).  Please see AVS from this visit for a full list of these instructions which I personally wrote for this pt and  are unique to this visit.

## 2018-03-10 NOTE — Progress Notes (Addendum)
Luis Richardson, male    DOB: [birth year],    MRN: [id number]  Brief patient profile:  51 yowm never smoker with lifelong h/x fall > spring runny nose / cough /sob eval by [employer] on allergy shots since 2009 with doe eval in [place] around 2014 "nl cxr per pt" moved into new appt fall 2018 and noted problems getting up steps much worse over summer of 2019 > CXR c/w PF so referred to pulmonary clinic 03/10/2018 by Dorothyann Peng.   Brief patient profile:  03/10/2018  Pulmonary / 1st office eval  Chief Complaint  Patient presents with  . Pulmonary Consult    Referred by Dorothyann Peng, NP for eval of abnormal cxr.  Pt c/o SOB for the past month. He gets SOB walking up stairs. He uses an albuterol inhaler once per wk on average.   Dyspnea:  MMRC1 = can walk nl pace, flat grade, can't hurry or go uphills or steps s sob new x one years   Cough: about the same as usual x years  Sleep: flat two pillows on cpap  SABA use: once a week    No obvious day to day or daytime variability or assoc excess/ purulent sputum or mucus plugs or hemoptysis or cp or chest tightness, subjective wheeze or overt sinus or hb symptoms.   Sleeping as above without nocturnal  or early am exacerbation  of respiratory  c/o's or need for noct saba. Also denies any obvious fluctuation of symptoms with weather or environmental changes or other aggravating or alleviating factors except as outlined above   No unusual exposure hx or h/o childhood pna/ asthma or knowledge of premature birth.  Current Allergies, Complete Past Medical History, Past Surgical History, Family History, and Social History were reviewed in [employer] record.  ROS  The following are not active complaints unless bolded Hoarseness, sore throat, dysphagia, dental problems, itching, sneezing,  nasal congestion or discharge of excess mucus or purulent secretions, ear ache,   fever, chills, sweats, unintended wt loss or wt gain,  classically pleuritic or exertional cp,  orthopnea pnd or arm/hand swelling  or leg swelling, presyncope, palpitations, abdominal pain, anorexia, nausea, vomiting, diarrhea  or change in bowel habits or change in bladder habits, change in stools or change in urine, dysuria, hematuria,  rash, arthralgias, visual complaints, headache, numbness, weakness or ataxia or problems with walking or coordination,  change in mood or  memory.        Current Meds  Medication Sig  . albuterol (PROAIR HFA) 108 (90 Base) MCG/ACT inhaler Inhale 2 puffs into the lungs every 4 (four) hours as needed for wheezing or shortness of breath.  . cetirizine (ZYRTEC) 10 MG tablet TAKE 1 TABLET BY MOUTH EVERY DAY FOR RUNNY NOSE OR ITCHING  . chlorthalidone (HYGROTON) 25 MG tablet TAKE 1 TABLET BY MOUTH EVERY DAY  . citalopram (CELEXA) 40 MG tablet Take 40 mg by mouth daily.  . cyanocobalamin (,VITAMIN B-12,) 1000 MCG/ML injection Inject into the muscle.  Marland Kitchen EPINEPHrine 0.3 mg/0.3 mL IJ SOAJ injection Use as directed for severe allergic reaction  . fluticasone (FLONASE) 50 MCG/ACT nasal spray USE 2 SPRAYS IN EACH NOSTRIL DAILY FOR STUFFY NOSE OR DRAINAGE  . fluticasone (FLOVENT HFA) 110 MCG/ACT inhaler Inhale 2 puffs into the lungs 2 (two) times daily.  Marland Kitchen glucose blood (ONE TOUCH ULTRA TEST) test strip -TEST ONCE DAILY OR AS PHYSICIAN INSTRUCTED (E119 NIDDM)  . metFORMIN (GLUCOPHAGE) 500 MG tablet TAKE  1 TABLET BY MOUTH EVERY EVENING FORDIABETES  . montelukast (SINGULAIR) 10 MG tablet Take 1 tablet (10 mg total) by mouth at bedtime.  . naproxen sodium (ALEVE) 220 MG tablet Take 220 mg by mouth daily as needed.  . testosterone cypionate (DEPOTESTOSTERONE CYPIONATE) 200 MG/ML injection Inject 200 mg into the muscle.  . traZODone (DESYREL) 50 MG tablet Take 50 mg by mouth at bedtime.  . [DISCONTINUED] hydrOXYzine (ATARAX/VISTARIL) 25 MG tablet Take 25 mg by mouth 3 (three) times daily as needed.        Past Medical History:    Diagnosis Date  . Asthma   . Hypertension   . Urticaria     Outpatient Medications Prior to Visit  Medication Sig Dispense Refill  . albuterol (PROAIR HFA) 108 (90 Base) MCG/ACT inhaler Inhale 2 puffs into the lungs every 4 (four) hours as needed for wheezing or shortness of breath. 1 Inhaler 3  . cetirizine (ZYRTEC) 10 MG tablet TAKE 1 TABLET BY MOUTH EVERY DAY FOR RUNNY NOSE OR ITCHING 30 tablet 0  . chlorthalidone (HYGROTON) 25 MG tablet TAKE 1 TABLET BY MOUTH EVERY DAY    . citalopram (CELEXA) 40 MG tablet Take 40 mg by mouth daily.    . cyanocobalamin (,VITAMIN B-12,) 1000 MCG/ML injection Inject into the muscle.    Marland Kitchen EPINEPHrine 0.3 mg/0.3 mL IJ SOAJ injection Use as directed for severe allergic reaction 1 Device 1  . fluticasone (FLONASE) 50 MCG/ACT nasal spray USE 2 SPRAYS IN EACH NOSTRIL DAILY FOR STUFFY NOSE OR DRAINAGE 18.2 g 3  . fluticasone (FLOVENT HFA) 110 MCG/ACT inhaler Inhale 2 puffs into the lungs 2 (two) times daily. 1 Inhaler 5  . glucose blood (ONE TOUCH ULTRA TEST) test strip -TEST ONCE DAILY OR AS PHYSICIAN INSTRUCTED (E119 NIDDM)    . metFORMIN (GLUCOPHAGE) 500 MG tablet TAKE 1 TABLET BY MOUTH EVERY EVENING FORDIABETES    . montelukast (SINGULAIR) 10 MG tablet Take 1 tablet (10 mg total) by mouth at bedtime. 30 tablet 0  . naproxen sodium (ALEVE) 220 MG tablet Take 220 mg by mouth daily as needed.    . testosterone cypionate (DEPOTESTOSTERONE CYPIONATE) 200 MG/ML injection Inject 200 mg into the muscle.    . traZODone (DESYREL) 50 MG tablet Take 50 mg by mouth at bedtime.    . hydrOXYzine (ATARAX/VISTARIL) 25 MG tablet Take 25 mg by mouth 3 (three) times daily as needed.    Marland Kitchen azelastine (ASTELIN) 0.1 % nasal spray Place 2 sprays into both nostrils 2 (two) times daily. Use in each nostril as directed 30 mL 5  . bacitracin-neomycin-polymyxin-hydrocortisone (CORTISPORIN) 1 % ointment Apply topically.    Marland Kitchen ketotifen (ZADITOR) 0.025 % ophthalmic solution Place 1 drop  into both eyes 2 (two) times daily. 10 mL 0  . NONFORMULARY OR COMPOUNDED ITEM     . predniSONE (DELTASONE) 20 MG tablet Take 1 tablet (20 mg total) by mouth daily with breakfast. 7 tablet 0  . sildenafil (VIAGRA) 50 MG tablet Take 0.5-1 tablets (25-50 mg total) by mouth daily as needed for erectile dysfunction. (Patient not taking: Reported on 02/18/2018) 10 tablet 0   Facility-Administered Medications Prior to Visit  Medication Dose Route Frequency Provider Last Rate Last Dose  . testosterone cypionate (DEPOTESTOTERONE CYPIONATE) injection 200 mg  200 mg Intramuscular Q14 Days [place], Betty G, MD   200 mg at 03/05/18 1119            Objective:     BP 140/90 (BP  Location: Left Arm, Cuff Size: Normal)   Pulse 71   Ht 5' 6.75" (1.695 m)   Wt 297 lb 8 oz (134.9 kg)   SpO2 95%   BMI 46.94 kg/m   SpO2: 95 % RA  Wt 288 on 12/09/16     Obese deaf wm nad    HEENT: nl dentition, turbinates bilaterally, and oropharynx. Nl external ear canals without cough reflex   NECK :  without JVD/Nodes/TM/ nl carotid upstrokes bilaterally   LUNGS: no acc muscle use,  Nl contour chest with minimal insp crackles in both bases  to A and P bilaterally without cough on insp or exp maneuvers   CV:  RRR  no s3 or murmur or increase in P2, and no edema   ABD:  soft and nontender with nl inspiratory excursion in the supine position. No bruits or organomegaly appreciated, bowel sounds nl  MS:  Nl gait/ ext warm without deformities, calf tenderness, cyanosis or clubbing No obvious joint restrictions   SKIN: warm and dry without lesions    NEURO:  alert, approp, nl sensorium with  no motor or cerebellar deficits apparent.      I personally reviewed images and agree with radiology impression as follows:  CXR:   02/1018 Coarse interstitial opacities throughout both lungs, a nonspecific finding whose chronicity is unknown without prior examinations. This could indicate chronic interstitial lung  disease or could indicate an acute drug reaction. 2. Borderline heart size. No acute cardiopulmonary disease Otherwise.     Labs ordered 03/10/2018   Bnp,cbc, bmet tsh, esr      Assessment   Pulmonary fibrosis ([place]) Onset doe 2014  Worse sob since summer 2019  Spirometry  01/21/18 FVC 3.10 (73%)   - cxr 02/17/18 suggestive ILD  03/10/2018  Walked RA x 3 laps @ 185 ft each stopped due to  End of study, nl pace, no sob but sats 93% at end    DDx for pulmonary fibrosis  includes idiopathic pulmonary fibrosis, pulmonary fibrosis associated with rheumatologic diseases (which have a relatively benign course in most cases) , adverse effect from  drugs such as chemotherapy or amiodarone exposure, nonspecific interstitial pneumonia which is typically steroid responsive, and chronic hypersensitivity pneumonitis.   In active  smokers Langerhan's Cell  Histiocyctosis (eosinophilic granuomatosis),  DIP,  and Respiratory Bronchiolitis ILD also need to be considered,  But can be dismissed here as never smoker.  Most likely this is IPF so needs HRCT to define it and full pfts to quantitate and then low threshold to start antifibrotic rx in the setting of apparent progression of symptoms over the last year that are not easily attributable  to another dx so sent for labs to r/o chf/ collagen vasc dz an return in 4 weeks to complete the w/u.     Morbid obesity with BMI of 45.0-49.9, adult (HCC) Body mass index is 46.94 kg/m.  -  trending up  Lab Results  Component Value Date   TSH 1.26 03/10/2018     Contributing to gerd risk/ doe/reviewed the need and the process to achieve and maintain neg calorie balance > defer f/u primary care including intermittently monitoring thyroid status       Total time devoted to counseling  > 50 % of initial 60 min office visit:  review case with pt via sign language interpretor  discussion of options/alternatives/ personally creating written customized instructions   in presence of pt  then going over those specific  Instructions  directly with the pt including how to use all of the meds but in particular covering each new medication in detail and the difference between the maintenance= "automatic" meds and the prns using an action plan format for the latter (If this problem/symptom => do that organization reading Left to right).  Please see AVS from this visit for a full list of these instructions which I personally wrote for this pt and  are unique to this visit.      Christinia Gully, MD 03/10/2018

## 2018-03-10 NOTE — Patient Instructions (Signed)
Please see patient coordinator before you leave today  to schedule HRCT of chest one day prior to next office visit   Please remember to go to the lab department downstairs in the basement  for your tests - we will call you with the results when they are available.   Please schedule a follow up office visit in 4 weeks, sooner if needed with pfts     .

## 2018-03-11 NOTE — Progress Notes (Signed)
Spoke with pt and notified of results per Dr. Wert. Pt verbalized understanding and denied any questions. 

## 2018-03-18 ENCOUNTER — Ambulatory Visit (INDEPENDENT_AMBULATORY_CARE_PROVIDER_SITE_OTHER): Payer: Medicare Other

## 2018-03-18 ENCOUNTER — Ambulatory Visit: Payer: Medicare Other

## 2018-03-18 DIAGNOSIS — J309 Allergic rhinitis, unspecified: Secondary | ICD-10-CM | POA: Diagnosis not present

## 2018-03-25 ENCOUNTER — Ambulatory Visit (INDEPENDENT_AMBULATORY_CARE_PROVIDER_SITE_OTHER): Payer: Medicare Other | Admitting: *Deleted

## 2018-03-25 DIAGNOSIS — J309 Allergic rhinitis, unspecified: Secondary | ICD-10-CM

## 2018-03-30 ENCOUNTER — Ambulatory Visit: Payer: Medicare Other | Admitting: Allergy and Immunology

## 2018-03-30 DIAGNOSIS — J309 Allergic rhinitis, unspecified: Secondary | ICD-10-CM

## 2018-04-01 ENCOUNTER — Ambulatory Visit: Payer: Medicare Other | Admitting: Family Medicine

## 2018-04-01 ENCOUNTER — Ambulatory Visit (INDEPENDENT_AMBULATORY_CARE_PROVIDER_SITE_OTHER): Payer: Medicare Other

## 2018-04-01 DIAGNOSIS — E349 Endocrine disorder, unspecified: Secondary | ICD-10-CM

## 2018-04-01 DIAGNOSIS — J309 Allergic rhinitis, unspecified: Secondary | ICD-10-CM

## 2018-04-01 MED ORDER — TESTOSTERONE CYPIONATE 200 MG/ML IM SOLN: 200.0000 mg | Freq: Once | INTRAMUSCULAR | 0 refills | Status: DC

## 2018-04-06 ENCOUNTER — Encounter: Payer: Self-pay | Admitting: Family Medicine

## 2018-04-06 ENCOUNTER — Ambulatory Visit (INDEPENDENT_AMBULATORY_CARE_PROVIDER_SITE_OTHER): Payer: Medicare Other | Admitting: Family Medicine

## 2018-04-06 VITALS — BP 138/90 | HR 77 | Temp 97.7°F | Resp 16 | Ht 66.75 in | Wt 298.4 lb

## 2018-04-06 DIAGNOSIS — Z5181 Encounter for therapeutic drug level monitoring: Secondary | ICD-10-CM

## 2018-04-06 DIAGNOSIS — Z79890 Hormone replacement therapy (postmenopausal): Secondary | ICD-10-CM | POA: Diagnosis not present

## 2018-04-06 DIAGNOSIS — R7303 Prediabetes: Secondary | ICD-10-CM

## 2018-04-06 DIAGNOSIS — G4700 Insomnia, unspecified: Secondary | ICD-10-CM | POA: Diagnosis not present

## 2018-04-06 DIAGNOSIS — E538 Deficiency of other specified B group vitamins: Secondary | ICD-10-CM | POA: Diagnosis not present

## 2018-04-06 DIAGNOSIS — E785 Hyperlipidemia, unspecified: Secondary | ICD-10-CM

## 2018-04-06 DIAGNOSIS — F324 Major depressive disorder, single episode, in partial remission: Secondary | ICD-10-CM | POA: Diagnosis not present

## 2018-04-06 DIAGNOSIS — E291 Testicular hypofunction: Secondary | ICD-10-CM

## 2018-04-06 DIAGNOSIS — I10 Essential (primary) hypertension: Secondary | ICD-10-CM | POA: Diagnosis not present

## 2018-04-06 DIAGNOSIS — N529 Male erectile dysfunction, unspecified: Secondary | ICD-10-CM

## 2018-04-06 LAB — BASIC METABOLIC PANEL
BUN: 18 mg/dL (ref 6–23)
CHLORIDE: 100 meq/L (ref 96–112)
CO2: 25 mEq/L (ref 19–32)
CREATININE: 1.03 mg/dL (ref 0.40–1.50)
Calcium: 8.9 mg/dL (ref 8.4–10.5)
GFR: 78.94 mL/min (ref >60.00)
GLUCOSE: 91 mg/dL (ref 70–99)
POTASSIUM: 3.5 meq/L (ref 3.5–5.1)
Sodium: 137 mEq/L (ref 135–145)

## 2018-04-06 LAB — CBC
HEMATOCRIT: 45.7 % (ref 39.0–52.0)
Hemoglobin: 15 g/dL (ref 13.0–17.0)
MCHC: 32.9 g/dL (ref 30.0–36.0)
MCV: 83.1 fl (ref 78.0–100.0)
Platelets: 234 10*3/uL (ref 150.0–400.0)
RBC: 5.5 Mil/uL (ref 4.22–5.81)
RDW: 15.9 % — AB (ref 11.5–15.5)
WBC: 9.7 10*3/uL (ref 4.0–10.5)

## 2018-04-06 LAB — VITAMIN B12: VITAMIN B 12: 210 pg/mL — AB (ref 211–911)

## 2018-04-06 LAB — PSA: PSA: 1.56 ng/mL (ref 0.10–4.00)

## 2018-04-06 LAB — HEMOGLOBIN A1C: HEMOGLOBIN A1C: 6.1 % (ref 4.6–6.5)

## 2018-04-06 MED ORDER — CHLORTHALIDONE 25 MG PO TABS: 25.0000 mg | ORAL_TABLET | Freq: Every day | ORAL | 1 refills | Status: DC

## 2018-04-06 NOTE — Progress Notes (Signed)
HPI:   [name] is a 57 y.o. male, who is here today for 4 months follow up.  Sign language interpreter is here to her with interrogation.  He was last seen on 01/21/18.   Hypertension:  Currently on chlorthalidone 25 mg. Is not checking BP at home. Last eye exam a couple months ago.  He is taking medications as instructed, no side effects reported.  He has not noted unusual headache, visual changes, exertional chest pain, dyspnea,  focal weakness, or edema.   Lab Results  Component Value Date   CREATININE 1.26 03/10/2018   BUN 13 03/10/2018   NA 140 03/10/2018   K 3.9 03/10/2018   CL 101 03/10/2018   CO2 31 03/10/2018   Low testosterone: He is on testosterone cypionate 200 mg q 2 weeks. Tolerating medication well.    Lab Results  Component Value Date   ALT 11 12/05/2017   AST 11 12/05/2017   ALKPHOS 83 12/05/2017   BILITOT 0.5 12/05/2017   Denies dysuria,increased urinary frequency, gross hematuria,or decreased urine output.   Lab Results  Component Value Date   WBC 9.9 03/10/2018   HGB 14.5 03/10/2018   HCT 44.4 03/10/2018   MCV 82.8 03/10/2018   PLT 235.0 03/10/2018    Erectile dysfunction: Last visit he requested pharmacologic treatment for ED, Viagra recommended. He did not tolerate medication well, it caused shortness of breath and some chest discomfort.  He has not had these symptoms since he discontinued medication.  Insomnia: Currently he is on trazodone 50 mg at bedtime.  He takes medication about 2-3 times per month.  Tolerating medication well.  Depression: He is on Celexa 40 mg daily.  He is taking medication in the morning, it causes drowsiness, so he wants to know if he can take it at bedtime. Denies depressed mood or suicidal thoughts.  Prediabetes: Currently he is on metformin 500 mg daily. He is not exercising regularly but he is active at home. He is not following a healthy diet consistently.  B12  deficiency: He has not had B12 injections in a few months.    Review of Systems  Constitutional: Negative for activity change, appetite change, fatigue and fever.  HENT: Positive for hearing loss. Negative for nosebleeds, sore throat and trouble swallowing.   Eyes: Negative for redness and visual disturbance.  Respiratory: Negative for cough, shortness of breath and wheezing.   Cardiovascular: Negative for chest pain, palpitations and leg swelling.  Gastrointestinal: Negative for abdominal pain, nausea and vomiting.  Endocrine: Negative for polydipsia, polyphagia and polyuria.  Genitourinary: Negative for decreased urine volume, dysuria and hematuria.  Skin: Negative for rash and wound.  Allergic/Immunologic: Positive for environmental allergies.  Neurological: Negative for syncope, weakness and headaches.  Psychiatric/Behavioral: Positive for sleep disturbance. Negative for confusion. The patient is nervous/anxious.     Current Outpatient Medications on File Prior to Visit  Medication Sig Dispense Refill  . albuterol (PROAIR HFA) 108 (90 Base) MCG/ACT inhaler Inhale 2 puffs into the lungs every 4 (four) hours as needed for wheezing or shortness of breath. 1 Inhaler 3  . azelastine (ASTELIN) 0.1 % nasal spray Place 2 sprays into both nostrils 2 (two) times daily. Use in each nostril as directed 30 mL 5  . bacitracin-neomycin-polymyxin-hydrocortisone (CORTISPORIN) 1 % ointment Apply topically.    . cetirizine (ZYRTEC) 10 MG tablet TAKE 1 TABLET BY MOUTH EVERY DAY FOR RUNNY NOSE OR ITCHING 30 tablet 0  . citalopram (CELEXA)  40 MG tablet Take 40 mg by mouth daily.    . cyanocobalamin (,VITAMIN B-12,) 1000 MCG/ML injection Inject into the muscle.    Marland Kitchen EPINEPHrine 0.3 mg/0.3 mL IJ SOAJ injection Use as directed for severe allergic reaction 1 Device 1  . fluticasone (FLONASE) 50 MCG/ACT nasal spray USE 2 SPRAYS IN EACH NOSTRIL DAILY FOR STUFFY NOSE OR DRAINAGE 18.2 g 3  . fluticasone (FLOVENT  HFA) 110 MCG/ACT inhaler Inhale 2 puffs into the lungs 2 (two) times daily. 1 Inhaler 5  . glucose blood (ONE TOUCH ULTRA TEST) test strip -TEST ONCE DAILY OR AS PHYSICIAN INSTRUCTED (E119 NIDDM)    . metFORMIN (GLUCOPHAGE) 500 MG tablet TAKE 1 TABLET BY MOUTH EVERY EVENING FORDIABETES    . montelukast (SINGULAIR) 10 MG tablet Take 1 tablet (10 mg total) by mouth at bedtime. 30 tablet 0  . naproxen sodium (ALEVE) 220 MG tablet Take 220 mg by mouth daily as needed.    . testosterone cypionate (DEPOTESTOSTERONE CYPIONATE) 200 MG/ML injection Inject 200 mg into the muscle.    . traZODone (DESYREL) 50 MG tablet Take 50 mg by mouth at bedtime.    Marland Kitchen testosterone cypionate (DEPOTESTOSTERONE CYPIONATE) 200 MG/ML injection Inject 1 mL (200 mg total) into the muscle once for 1 dose. 10 mL 0   Current Facility-Administered Medications on File Prior to Visit  Medication Dose Route Frequency Provider Last Rate Last Dose  . testosterone cypionate (DEPOTESTOTERONE CYPIONATE) injection 200 mg  200 mg Intramuscular Q14 Days [place], Kentaro Alewine G, MD   200 mg at 03/05/18 1119     Past Medical History:  Diagnosis Date  . Asthma   . Hypertension   . Urticaria    Allergies  Allergen Reactions  . Grapefruit Extract Other (See Comments)    Blisters all over body Blisters all over body  . Lemon Oil Other (See Comments)    Blisters all over body Blisters all over body  . Lime, Sulfurated Other (See Comments)    Blisters all over body Blisters all over body  . Orange (Diagnostic) Other (See Comments)    Blisters all over body  . Orange Oil     Blisters all over body  . Penicillin G     Syncope, rash  . Penicillins   . Topiramate Other (See Comments)    Social History   Socioeconomic History  . Marital status: Married    Spouse name: Not on file  . Number of children: Not on file  . Years of education: Not on file  . Highest education level: Not on file  Occupational History  . Not on file  Social  Needs  . Financial resource strain: Not on file  . Food insecurity:    Worry: Not on file    Inability: Not on file  . Transportation needs:    Medical: Not on file    Non-medical: Not on file  Tobacco Use  . Smoking status: Never Smoker  . Smokeless tobacco: Current User    Types: Chew  Substance and Sexual Activity  . Alcohol use: Yes    Alcohol/week: 0.0 standard drinks  . Drug use: No  . Sexual activity: Not on file  Lifestyle  . Physical activity:    Days per week: Not on file    Minutes per session: Not on file  . Stress: Not on file  Relationships  . Social connections:    Talks on phone: Not on file    Gets together: Not on  file    Attends religious service: Not on file    Active member of club or organization: Not on file    Attends meetings of clubs or organizations: Not on file    Relationship status: Not on file  Other Topics Concern  . Not on file  Social History Narrative  . Not on file    Vitals:   04/06/18 0944  BP: 138/90  Pulse: 77  Resp: 16  Temp: 97.7 F (36.5 C)  SpO2: 96%   Body mass index is 47.08 kg/m.   Physical Exam  Nursing note reviewed. Constitutional: He is oriented to person, place, and time. He appears well-developed. No distress.  HENT:  Head: Normocephalic and atraumatic.  Mouth/Throat: Oropharynx is clear and moist and mucous membranes are normal.  Eyes: Pupils are equal, round, and reactive to light. Conjunctivae are normal.  Cardiovascular: Normal rate and regular rhythm.  No murmur heard. Pulses:      Dorsalis pedis pulses are 2+ on the right side, and 2+ on the left side.  Respiratory: Effort normal and breath sounds normal. No respiratory distress.  GI: Soft. He exhibits no mass. There is no hepatomegaly. There is no tenderness.  Musculoskeletal: He exhibits no edema.  Lymphadenopathy:    He has no cervical adenopathy.  Neurological: He is alert and oriented to person, place, and time. He has normal strength. No  cranial nerve deficit. Gait normal.  Skin: Skin is warm. No rash noted. No erythema.  Psychiatric: He has a normal mood and affect.  Well groomed, good eye contact.     ASSESSMENT AND PLAN:   Mr. Luis Richardson was seen today for 4 months follow-up.  Orders Placed This Encounter  Procedures  . PSA  . CBC  . Vitamin B12  . Hemoglobin A1c  . Basic metabolic panel    Lab Results  Component Value Date   PSA 1.56 04/06/2018   Lab Results  Component Value Date   WBC 9.7 04/06/2018   HGB 15.0 04/06/2018   HCT 45.7 04/06/2018   MCV 83.1 04/06/2018   PLT 234.0 04/06/2018   Lab Results  Component Value Date   CREATININE 1.03 04/06/2018   BUN 18 04/06/2018   NA 137 04/06/2018   K 3.5 04/06/2018   CL 100 04/06/2018   CO2 25 04/06/2018   Lab Results  Component Value Date   HGBA1C 6.1 04/06/2018    Hypertension BP is slightly elevated. Recommend monitoring BP at home. For now no changes in current management. Eye exam is current. Follow-up in 5 to 6 months.  Erectile dysfunction We discussed some side effects of pharmacologic treatment. Recommend not trying any of the medication in the market for ED, they have similar side effects. Offered urology referral but he prefers to wait for now.   Male hypogonadism No changes in current testosterone dose. Tolerating well. We discussed some side effects of testosterone. Follow-up in 5 to 6 months.  Prediabetes Healthy lifestyle recommended for primary prevention of DM. Further recommendation will be given according to A1c results.  B12 deficiency For now we will hold on B12 injections, further recommendation will be given according to B12 results.  Hyperlipemia He has not tolerated simvastatin well in the past. For now he will continue nonpharmacologic treatment, he needs to follow a healthier diet. Further recommendation will be given according to lipid panel results.  Depression, major, in partial  remission (HCC) Problem is well controlled. He can continue taking Celexa at bedtime,  we discussed some side effects as well as risk of interaction with trazodone. Follow-up 5 to 6 months.  Insomnia disorder Good sleep hygiene. He will continue trazodone 50 mg as needed. Follow-up in 5 to 6 months.      Aailyah Dunbar G. [place], MD  [facility]. [place] office.

## 2018-04-06 NOTE — Assessment & Plan Note (Signed)
For now we will hold on B12 injections, further recommendation will be given according to B12 results.

## 2018-04-06 NOTE — Assessment & Plan Note (Signed)
We discussed some side effects of pharmacologic treatment. Recommend not trying any of the medication in the market for ED, they have similar side effects. Offered urology referral but he prefers to wait for now.

## 2018-04-06 NOTE — Assessment & Plan Note (Signed)
He has not tolerated simvastatin well in the past. For now he will continue nonpharmacologic treatment, he needs to follow a healthier diet. Further recommendation will be given according to lipid panel results.

## 2018-04-06 NOTE — Assessment & Plan Note (Signed)
Good sleep hygiene. He will continue trazodone 50 mg as needed. Follow-up in 5 to 6 months.

## 2018-04-06 NOTE — Assessment & Plan Note (Signed)
No changes in current testosterone dose. Tolerating well. We discussed some side effects of testosterone. Follow-up in 5 to 6 months.

## 2018-04-06 NOTE — Assessment & Plan Note (Signed)
Healthy lifestyle recommended for primary prevention of DM. Further recommendation will be given according to A1c results.

## 2018-04-06 NOTE — Patient Instructions (Addendum)
A few things to remember from today's visit:   Essential hypertension - Plan: Basic metabolic panel  Major depressive disorder in partial remission, unspecified whether recurrent (HCC)  Insomnia, unspecified type  Male hypogonadism  Encounter for monitoring testosterone replacement therapy - Plan: PSA, CBC  Erectile dysfunction, unspecified erectile dysfunction type  Prediabetes - Plan: Hemoglobin A1c, Basic metabolic panel  Hyperlipidemia, unspecified hyperlipidemia type  B12 deficiency - Plan: Vitamin B12  You can continue taking Celexa at bedtime. Trazodone as needed for sleep, when you take a try not to take it with Celexa. No changes in testosterone today. We need to avoid medication for erectile dysfunction, if you are interested in appointment with urologist please let me know. I think I can see you back in 5 to 6 months, before if you have any concerns.  Please be sure medication list is accurate.

## 2018-04-06 NOTE — Assessment & Plan Note (Signed)
Problem is well controlled. He can continue taking Celexa at bedtime, we discussed some side effects as well as risk of interaction with trazodone. Follow-up 5 to 6 months.

## 2018-04-06 NOTE — Assessment & Plan Note (Signed)
BP is slightly elevated. Recommend monitoring BP at home. For now no changes in current management. Eye exam is current. Follow-up in 5 to 6 months.

## 2018-04-08 ENCOUNTER — Ambulatory Visit (INDEPENDENT_AMBULATORY_CARE_PROVIDER_SITE_OTHER): Payer: Medicare Other | Admitting: *Deleted

## 2018-04-08 DIAGNOSIS — J309 Allergic rhinitis, unspecified: Secondary | ICD-10-CM | POA: Diagnosis not present

## 2018-04-09 ENCOUNTER — Encounter: Payer: Self-pay | Admitting: Family Medicine

## 2018-04-09 ENCOUNTER — Ambulatory Visit (INDEPENDENT_AMBULATORY_CARE_PROVIDER_SITE_OTHER)
Admission: RE | Admit: 2018-04-09 | Discharge: 2018-04-09 | Disposition: A | Discharge status: Home / Self Care | Payer: Medicare Other | Source: Ambulatory Visit | Attending: Internal Medicine | Admitting: Internal Medicine

## 2018-04-09 DIAGNOSIS — R0609 Other forms of dyspnea: Secondary | ICD-10-CM

## 2018-04-14 ENCOUNTER — Ambulatory Visit: Payer: Medicare Other | Admitting: Internal Medicine

## 2018-04-14 ENCOUNTER — Encounter: Payer: Self-pay | Admitting: Internal Medicine

## 2018-04-14 ENCOUNTER — Ambulatory Visit (INDEPENDENT_AMBULATORY_CARE_PROVIDER_SITE_OTHER): Payer: Medicare Other | Admitting: Internal Medicine

## 2018-04-14 VITALS — BP 150/90 | HR 57 | Ht 67.0 in | Wt 299.0 lb

## 2018-04-14 DIAGNOSIS — R918 Other nonspecific abnormal finding of lung field: Secondary | ICD-10-CM | POA: Diagnosis not present

## 2018-04-14 DIAGNOSIS — R0609 Other forms of dyspnea: Secondary | ICD-10-CM

## 2018-04-14 DIAGNOSIS — J8410 Pulmonary fibrosis, unspecified: Secondary | ICD-10-CM | POA: Diagnosis not present

## 2018-04-14 DIAGNOSIS — Z6841 Body Mass Index (BMI) 40.0 and over, adult: Secondary | ICD-10-CM

## 2018-04-14 DIAGNOSIS — E041 Nontoxic single thyroid nodule: Secondary | ICD-10-CM

## 2018-04-14 LAB — PULMONARY FUNCTION TEST
FEF 25-75 PRE: 2.2 L/s
FEF 25-75 Post: 1.89 L/sec
FEF2575-%CHANGE-POST: -14 %
FEF2575-%PRED-PRE: 77 %
FEF2575-%Pred-Post: 66 %
FEV1-%Change-Post: -4 %
FEV1-%PRED-POST: 76 %
FEV1-%Pred-Pre: 80 %
FEV1-Post: 2.55 L
FEV1-Pre: 2.67 L
FEV1FVC-%CHANGE-POST: -7 %
FEV1FVC-%Pred-Pre: 100 %
FEV6-%CHANGE-POST: 2 %
FEV6-%PRED-POST: 85 %
FEV6-%Pred-Pre: 83 %
FEV6-Post: 3.57 L
FEV6-Pre: 3.47 L
FEV6FVC-%CHANGE-POST: 0 %
FEV6FVC-%Pred-Post: 104 %
FEV6FVC-%Pred-Pre: 104 %
FVC-%CHANGE-POST: 3 %
FVC-%PRED-POST: 81 %
FVC-%Pred-Pre: 79 %
FVC-Post: 3.58 L
FVC-Pre: 3.47 L
POST FEV6/FVC RATIO: 100 %
PRE FEV1/FVC RATIO: 77 %
Post FEV1/FVC ratio: 71 %
Pre FEV6/FVC Ratio: 100 %
RV % pred: 95 %
RV: 1.93 L
TLC % PRED: 89 %
TLC: 5.68 L

## 2018-04-14 NOTE — Progress Notes (Signed)
PFT completed today.  

## 2018-04-14 NOTE — Patient Instructions (Signed)
We will contact you in 6 months for a follow up ct chest to look at a cyst in the midline but you do not appear at this point to have any lung problems so follow up in this clinic is as needed    Your main breathing problem is due to your weight so need to work on this per your pcp.  Good luck!

## 2018-04-14 NOTE — Progress Notes (Signed)
Luis Richardson, male    DOB: 05-16-1961,    MRN: [id number]  Brief patient profile:  30 yowm never smoker with lifelong h/x fall > spring runny nose / cough /sob eval by [employer] on allergy shots since 2009 with doe eval in [place] around 2014 "nl cxr per pt" moved into new appt fall 2018 and noted problems getting up steps much worse over summer of 2019 > CXR c/w PF so referred to pulmonary clinic 03/10/2018 by Shirline Frees.   Brief patient profile:  03/10/2018  Pulmonary / 1st office eval  Chief Complaint  Patient presents with  . Pulmonary Consult    Referred by Shirline Frees, NP for eval of abnormal cxr.  Pt c/o SOB for the past month. He gets SOB walking up stairs. He uses an albuterol inhaler once per wk on average.   Dyspnea:  MMRC1 = can walk nl pace, flat grade, can't hurry or go uphills or steps s sob new x one years   Cough: about the same as usual x years  Sleep: flat two pillows on cpap  SABA use: once a week  rec HRCT chest  Neg for iLD, goiter > u/s rec, 2.2 ant med cyst > f/u ct 6 m   04/14/2018  f/u ov/Kiandria Clum re: doe  Chief Complaint  Patient presents with  . Follow-up    PFT's done today. Breathing has improved some.    Dyspnea:  Min improvement = MMRC1 = can walk nl pace, flat grade, can't hurry or go uphills or steps s sob   Cough: none Sleeping: ok on cpap SABA use: none 02: none  No obvious day to day or daytime variability or assoc excess/ purulent sputum or mucus plugs or hemoptysis or cp or chest tightness, subjective wheeze or overt sinus or hb symptoms.   Sleeping as above without nocturnal  or early am exacerbation  of respiratory  c/o's or need for noct saba. Also denies any obvious fluctuation of symptoms with weather or environmental changes or other aggravating or alleviating factors except as outlined above   No unusual exposure hx or h/o childhood pna/ asthma or knowledge of premature birth.  Current Allergies, Complete Past Medical History,  Past Surgical History, Family History, and Social History were reviewed in [employer] record.  ROS  The following are not active complaints unless bolded Hoarseness, sore throat, dysphagia, dental problems, itching, sneezing,  nasal congestion or discharge of excess mucus or purulent secretions, ear ache,   fever, chills, sweats, unintended wt loss or wt gain, classically pleuritic or exertional cp,  orthopnea pnd or arm/hand swelling  or leg swelling, presyncope, palpitations, abdominal pain, anorexia, nausea, vomiting, diarrhea  or change in bowel habits or change in bladder habits, change in stools or change in urine, dysuria, hematuria,  rash, arthralgias, visual complaints, headache, numbness, weakness or ataxia or problems with walking or coordination,  change in mood or  memory.        Current Meds  Medication Sig  . albuterol (PROAIR HFA) 108 (90 Base) MCG/ACT inhaler Inhale 2 puffs into the lungs every 4 (four) hours as needed for wheezing or shortness of breath.  Marland Kitchen azelastine (ASTELIN) 0.1 % nasal spray Place 2 sprays into both nostrils 2 (two) times daily. Use in each nostril as directed  . bacitracin-neomycin-polymyxin-hydrocortisone (CORTISPORIN) 1 % ointment Apply topically.  . cetirizine (ZYRTEC) 10 MG tablet TAKE 1 TABLET BY MOUTH EVERY DAY FOR RUNNY NOSE OR ITCHING  .  chlorthalidone (HYGROTON) 25 MG tablet Take 1 tablet (25 mg total) by mouth daily.  . citalopram (CELEXA) 40 MG tablet Take 40 mg by mouth daily.  . cyanocobalamin (,VITAMIN B-12,) 1000 MCG/ML injection Inject into the muscle.  Marland Kitchen EPINEPHrine 0.3 mg/0.3 mL IJ SOAJ injection Use as directed for severe allergic reaction  . fluticasone (FLONASE) 50 MCG/ACT nasal spray USE 2 SPRAYS IN EACH NOSTRIL DAILY FOR STUFFY NOSE OR DRAINAGE  . fluticasone (FLOVENT HFA) 110 MCG/ACT inhaler Inhale 2 puffs into the lungs 2 (two) times daily.  Marland Kitchen glucose blood (ONE TOUCH ULTRA TEST) test strip -TEST ONCE DAILY OR  AS PHYSICIAN INSTRUCTED (E119 NIDDM)  . metFORMIN (GLUCOPHAGE) 500 MG tablet TAKE 1 TABLET BY MOUTH EVERY EVENING FORDIABETES  . montelukast (SINGULAIR) 10 MG tablet Take 1 tablet (10 mg total) by mouth at bedtime.  . naproxen sodium (ALEVE) 220 MG tablet Take 220 mg by mouth daily as needed.  . testosterone cypionate (DEPOTESTOSTERONE CYPIONATE) 200 MG/ML injection Inject 200 mg into the muscle.  . traZODone (DESYREL) 50 MG tablet Take 50 mg by mouth at bedtime.                  Objective:      Wt Readings from Last 3 Encounters:  04/14/18 299 lb (135.6 kg)  04/06/18 298 lb 6 oz (135.3 kg)  03/10/18 297 lb 8 oz (134.9 kg)     Vital signs reviewed - Note on arrival 02 sats  95% on RA         Obese wm nad    HEENT: nl dentition, turbinates bilaterally, and oropharynx. Nl external ear canals without cough reflex   NECK :  without JVD/Nodes/TM/ nl carotid upstrokes bilaterally   LUNGS: no acc muscle use,  Nl contour chest which is clear to A and P bilaterally without cough on insp or exp maneuvers   CV:  RRR  no s3 or murmur or increase in P2, and no edema   ABD:  ovbese soft and nontender with nl inspiratory excursion in the supine position. No bruits or organomegaly appreciated, bowel sounds nl  MS:  Nl gait/ ext warm without deformities, calf tenderness, cyanosis or clubbing No obvious joint restrictions   SKIN: warm and dry without lesions    NEURO:  alert, approp, nl sensorium with  no motor or cerebellar deficits apparent.         Assessment

## 2018-04-15 ENCOUNTER — Encounter: Payer: Self-pay | Admitting: Internal Medicine

## 2018-04-15 ENCOUNTER — Encounter: Payer: Medicare Other | Admitting: Family Medicine

## 2018-04-15 ENCOUNTER — Ambulatory Visit (INDEPENDENT_AMBULATORY_CARE_PROVIDER_SITE_OTHER): Payer: Medicare Other | Admitting: *Deleted

## 2018-04-15 ENCOUNTER — Telehealth: Payer: Self-pay | Admitting: *Deleted

## 2018-04-15 DIAGNOSIS — R918 Other nonspecific abnormal finding of lung field: Secondary | ICD-10-CM | POA: Insufficient documentation

## 2018-04-15 DIAGNOSIS — E538 Deficiency of other specified B group vitamins: Secondary | ICD-10-CM

## 2018-04-15 DIAGNOSIS — Z5181 Encounter for therapeutic drug level monitoring: Secondary | ICD-10-CM

## 2018-04-15 DIAGNOSIS — E041 Nontoxic single thyroid nodule: Secondary | ICD-10-CM | POA: Insufficient documentation

## 2018-04-15 DIAGNOSIS — J309 Allergic rhinitis, unspecified: Secondary | ICD-10-CM | POA: Diagnosis not present

## 2018-04-15 DIAGNOSIS — Z79890 Hormone replacement therapy (postmenopausal): Secondary | ICD-10-CM

## 2018-04-15 MED ORDER — CYANOCOBALAMIN 1000 MCG/ML IJ SOLN
1000.0000 ug | Freq: Once | INTRAMUSCULAR | Status: AC
  Administered 2018-04-15: 1000 ug via INTRAMUSCULAR

## 2018-04-15 MED ORDER — TESTOSTERONE CYPIONATE 200 MG/ML IM SOLN: 200.0000 mg | Freq: Once | INTRAMUSCULAR | 0 refills | Status: DC

## 2018-04-15 NOTE — Assessment & Plan Note (Addendum)
See CT Chest 04/09/18 > f/u 6 m in computer for CT with contrast  Discussed in detail all the  indications, usual  risks and alternatives  relative to the benefits with patient who agrees to proceed with conservative f/u as outlined

## 2018-04-15 NOTE — Telephone Encounter (Signed)
-----   Message from Nyoka Cowden, MD sent at 04/15/2018 10:31 AM EST -----   rec u/s thyroid  I forgot to mention this to the patient but it is most likely a benign finding and I recommend a thyroid ultrasound to be complete and he can take this up with his PCP at the next regular visit at his discretion if he wishes..  Be sure to send his PCP a copy of this message

## 2018-04-15 NOTE — Assessment & Plan Note (Signed)
03/10/2018  Walked RA x 3 laps @ 185 ft each stopped due to  End of study, nl pace, no sob but sats 93% at end   see Pulmonary fibrosis  PFT's  04/14/2018  FEV1 2.67  (80 % ) ratio 77  p no % improvement from saba p nothing prior to study - not able to do dlco and erv 60%  HRCT 04/09/18 no ild    >>> reviewed pfts and need for wt loss, no pulmonary f/u needed for this problem

## 2018-04-15 NOTE — Telephone Encounter (Signed)
Spoke with the pt through relay service and notified of recs per Dr Sherene Sires  He verbalized understanding and denied any questions/concerns He is aware I am sending fax of this this encounter to PCP- Dr Swaziland  He is aware to reach out to them if he does not hear something soon  Copy of this note faxed to Dr Elvis Coil office

## 2018-04-15 NOTE — Assessment & Plan Note (Signed)
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

## 2018-04-15 NOTE — Assessment & Plan Note (Signed)
Onset doe 2014  Worse sob since summer 2019  Spirometry  01/21/18 FVC 3.10 (73%)   - cxr 02/17/18 suggestive ILD  03/10/2018  Walked RA x 3 laps @ 185 ft each stopped due to  End of study, nl pace, no sob but sats 93% at end  - HRCT chest 04/09/2018  1. No convincing findings of interstitial lung disease at this time. 2. Mild cardiomegaly. Minimal interlobular septal thickening throughout both lungs, cannot exclude minimal pulmonary edema. 3. Simple 2.2 cm cystic lesion in the right anterior mediastinum abutting the pericardium. Differential includes pericardial cyst or cystic thymic lesion. Suggest initial follow-up chest CT with IV contrast in 3-6 months to document stability. 4. Multinodular goiter. Dominant calcified 2.0 cm lower left thyroid lobe nodule. Thyroid ultrasound correlation is indicated. This follows ACR consensus guidelines: Managing Incidental Thyroid Nodules Detected on Imaging: White Paper of the ACR Incidental Thyroid Findings Committee. J Am Coll Radiol 2015; 12:143-150. 5. Subpleural 5 mm solid posterior left upper lobe pulmonary nodule. No follow-up needed if patient is low-risk. Non-contrast chest CT can be considered in 12 months if patient is high-risk - Spirometry 04/14/2018  FVC  3.47 (79%)  But unable to do dlco     > no ild/ no f/u for this problem

## 2018-04-15 NOTE — Assessment & Plan Note (Signed)
Body mass index is 46.83 kg/m.  -  trending up slightly  Lab Results  Component Value Date   TSH 1.26 03/10/2018     Contributing to gerd risk/ doe/reviewed the need and the process to achieve and maintain neg calorie balance > defer f/u primary care including intermittently monitoring thyroid status

## 2018-04-29 ENCOUNTER — Ambulatory Visit: Payer: Medicare Other

## 2018-05-11 ENCOUNTER — Encounter: Payer: Self-pay | Admitting: Allergy and Immunology

## 2018-05-11 ENCOUNTER — Ambulatory Visit (INDEPENDENT_AMBULATORY_CARE_PROVIDER_SITE_OTHER): Payer: Medicare Other | Admitting: Allergy and Immunology

## 2018-05-11 VITALS — BP 122/84 | HR 76 | Temp 97.6°F | Resp 18 | Ht 66.2 in | Wt 300.0 lb

## 2018-05-11 DIAGNOSIS — T7800XD Anaphylactic reaction due to unspecified food, subsequent encounter: Secondary | ICD-10-CM | POA: Diagnosis not present

## 2018-05-11 DIAGNOSIS — J3089 Other allergic rhinitis: Secondary | ICD-10-CM

## 2018-05-11 DIAGNOSIS — J0100 Acute maxillary sinusitis, unspecified: Secondary | ICD-10-CM

## 2018-05-11 DIAGNOSIS — J4541 Moderate persistent asthma with (acute) exacerbation: Secondary | ICD-10-CM

## 2018-05-11 MED ORDER — AZELASTINE HCL 0.1 % NA SOLN: 1.0000 | Freq: Two times a day (BID) | NASAL | 12 refills | Status: DC | PRN

## 2018-05-11 MED ORDER — ALBUTEROL SULFATE HFA 108 (90 BASE) MCG/ACT IN AERS: 2.0000 | INHALATION_SPRAY | RESPIRATORY_TRACT | 2 refills | Status: DC | PRN

## 2018-05-11 MED ORDER — FLUTICASONE PROPIONATE HFA 110 MCG/ACT IN AERO: 2.0000 | INHALATION_SPRAY | Freq: Two times a day (BID) | RESPIRATORY_TRACT | 5 refills | Status: DC

## 2018-05-11 MED ORDER — MONTELUKAST SODIUM 10 MG PO TABS: 10.0000 mg | ORAL_TABLET | Freq: Every day | ORAL | 5 refills | Status: DC

## 2018-05-11 NOTE — Assessment & Plan Note (Signed)
   Continue appropriate allergen avoidance measures.  Montelukast, fluticasone nasal spray, and azelastine nasal spray (as above).

## 2018-05-11 NOTE — Assessment & Plan Note (Signed)
   Prednisone has been provided (as above).  During sinus infections, use fluticasone nasal spray twice daily.  In addition, during sinus infections add azelastine nasal spray, 2 sprays per nostril twice daily.  A prescription has been provided.  Nasal saline spray (i.e., Simply Saline) or nasal saline lavage (i.e., NeilMed) is recommended as needed and prior to medicated nasal sprays.  For thick post nasal drainage, add guaifenesin 1200 mg (Mucinex Maximum Strength)  twice daily as needed with adequate hydration as discussed.

## 2018-05-11 NOTE — Assessment & Plan Note (Addendum)
   Continue meticulous avoidance of oranges, lemons, lime, and grapefruit and have access to epinephrine autoinjector 2 pack in case of accidental ingestion.  Food allergy action plan is in place.

## 2018-05-11 NOTE — Assessment & Plan Note (Signed)
   Prednisone has been provided, 40 mg x3 days, 20 mg x1 day, 10 mg x1 day, then stop.  For now, and during respiratory tract infections and asthma flares, increase Qvar 80 g to 3 inhalations 3 times per day.  Once symptoms have returned to baseline, may resume previous dose of 2 inhalations twice a day.  Continue montelukast 10 mg daily at bedtime and albuterol HFA, 1 to 2 inhalations every 4-6 hours if needed.  The patient has been asked to contact me if his symptoms persist or progress. Otherwise, he may return for follow up in 5 months.

## 2018-05-11 NOTE — Progress Notes (Signed)
Follow-up Note  RE: Luis Richardson MRN: [id number] DOB: June 25, 1960 Date of Office Visit: 05/11/2018  Primary care provider: Swaziland, Betty G, MD Referring provider: [place], Betty G, MD  History of present illness: Luis Richardson is a 57 y.o. male with persistent asthma and allergic rhinitis presenting today for a sick visit.  He was last seen in this clinic on January 21, 2018.  He reports that over the past 2 days he has experienced coughing, chest tightness, and dyspnea.  He has required albuterol rescue several times over the past 24 hours.  He is currently taking Qvar 80 g Redihaler, 2 inhalations twice daily, and montelukast 10 mg daily.  While on this regimen, he typically requires albuterol rescue 2 or 3 times per week on average.  He rarely experiences nocturnal awakenings due to lower respiratory symptoms.  He also complains of nasal congestion, sinus congestion, thick postnasal drainage, and thick clear expectorant.  He is currently using fluticasone nasal spray daily.  He uses nasal saline sporadically.  He carefully avoids citrus fruits and has access to an epinephrine autoinjector.  Assessment and plan: Moderate persistent asthma  Prednisone has been provided, 40 mg x3 days, 20 mg x1 day, 10 mg x1 day, then stop.  For now, and during respiratory tract infections and asthma flares, increase Qvar 80 g to 3 inhalations 3 times per day.  Once symptoms have returned to baseline, may resume previous dose of 2 inhalations twice a day.  Continue montelukast 10 mg daily at bedtime and albuterol HFA, 1 to 2 inhalations every 4-6 hours if needed.  The patient has been asked to contact me if his symptoms persist or progress. Otherwise, he may return for follow up in 5 months.  Acute sinusitis  Prednisone has been provided (as above).  During sinus infections, use fluticasone nasal spray twice daily.  In addition, during sinus infections add azelastine nasal spray, 2 sprays  per nostril twice daily.  A prescription has been provided.  Nasal saline spray (i.e., Simply Saline) or nasal saline lavage (i.e., NeilMed) is recommended as needed and prior to medicated nasal sprays.  For thick post nasal drainage, add guaifenesin 1200 mg (Mucinex Maximum Strength)  twice daily as needed with adequate hydration as discussed.  Allergic rhinitis  Continue appropriate allergen avoidance measures.  Montelukast, fluticasone nasal spray, and azelastine nasal spray (as above).  Food allergy  Continue meticulous avoidance of oranges, lemons, lime, and grapefruit and have access to epinephrine autoinjector 2 pack in case of accidental ingestion.  Food allergy action plan is in place.   Meds ordered this encounter  Medications  . albuterol (PROAIR HFA) 108 (90 Base) MCG/ACT inhaler    Sig: Inhale 2 puffs into the lungs every 4 (four) hours as needed for wheezing or shortness of breath.    Dispense:  1 Inhaler    Refill:  2  . fluticasone (FLOVENT HFA) 110 MCG/ACT inhaler    Sig: Inhale 2 puffs into the lungs 2 (two) times daily.    Dispense:  1 Inhaler    Refill:  5  . montelukast (SINGULAIR) 10 MG tablet    Sig: Take 1 tablet (10 mg total) by mouth at bedtime.    Dispense:  30 tablet    Refill:  5    For cough or wheeze  . azelastine (ASTELIN) 0.1 % nasal spray    Sig: Place 1 spray into both nostrils 2 (two) times daily as needed for rhinitis. Use in  each nostril as directed    Dispense:  30 mL    Refill:  12    Diagnostics: Spirometry reveals an FVC of 3.28 L and an FEV1 of 2.53 L (78% predicted) with 120 mL (5%) postbronchodilator improvement.  Please see scanned spirometry results for details.    Physical examination: Blood pressure 122/84, pulse 76, temperature 97.6 F (36.4 C), temperature source Oral, resp. rate 18, height 5' 6.2" (1.681 m), weight 300 lb (136.1 kg), SpO2 98 %.  General: Alert, interactive, in no acute distress. HEENT: TMs pearly  gray, turbinates edematous with thick discharge, post-pharynx moderately erythematous. Neck: Supple without lymphadenopathy. Lungs: Mildly decreased breath sounds bilaterally without wheezing, rhonchi or rales. CV: Normal S1, S2 without murmurs. Skin: Warm and dry, without lesions or rashes.  The following portions of the patient's history were reviewed and updated as appropriate: allergies, current medications, past family history, past medical history, past social history, past surgical history and problem list.  Allergies as of 05/11/2018      Reactions   Grapefruit Extract Other (See Comments)   Blisters all over body Blisters all over body   Lemon Oil Other (See Comments)   Blisters all over body Blisters all over body   Lime, Sulfurated Other (See Comments)   Blisters all over body Blisters all over body   Orange (diagnostic) Other (See Comments)   Blisters all over body   Orange Oil    Blisters all over body   Penicillin G    Syncope, rash   Penicillins    Topiramate Other (See Comments)      Medication List        Accurate as of 05/11/18  3:23 PM. Always use your most recent med list.          albuterol 108 (90 Base) MCG/ACT inhaler Commonly known as:  PROVENTIL HFA;VENTOLIN HFA Inhale 2 puffs into the lungs every 4 (four) hours as needed for wheezing or shortness of breath.   azelastine 0.1 % nasal spray Commonly known as:  ASTELIN Place 1 spray into both nostrils 2 (two) times daily as needed for rhinitis. Use in each nostril as directed   bacitracin-neomycin-polymyxin-hydrocortisone 1 % ointment Commonly known as:  CORTISPORIN Apply topically.   cetirizine 10 MG tablet Commonly known as:  ZYRTEC TAKE 1 TABLET BY MOUTH EVERY DAY FOR RUNNY NOSE OR ITCHING   chlorthalidone 25 MG tablet Commonly known as:  HYGROTON Take 1 tablet (25 mg total) by mouth daily.   citalopram 40 MG tablet Commonly known as:  CELEXA Take 40 mg by mouth daily.     cyanocobalamin 1000 MCG/ML injection Commonly known as:  (VITAMIN B-12) Inject into the muscle.   EPINEPHrine 0.3 mg/0.3 mL Soaj injection Commonly known as:  EPI-PEN Use as directed for severe allergic reaction   fluticasone 110 MCG/ACT inhaler Commonly known as:  FLOVENT HFA Inhale 2 puffs into the lungs 2 (two) times daily.   fluticasone 50 MCG/ACT nasal spray Commonly known as:  FLONASE USE 2 SPRAYS IN EACH NOSTRIL DAILY FOR STUFFY NOSE OR DRAINAGE   metFORMIN 500 MG tablet Commonly known as:  GLUCOPHAGE TAKE 1 TABLET BY MOUTH EVERY EVENING FORDIABETES   montelukast 10 MG tablet Commonly known as:  SINGULAIR Take 1 tablet (10 mg total) by mouth at bedtime.   mupirocin ointment 2 % Commonly known as:  BACTROBAN   naproxen sodium 220 MG tablet Commonly known as:  ALEVE Take 220 mg by mouth daily as needed.  ONE TOUCH ULTRA TEST test strip Generic drug:  glucose blood -TEST ONCE DAILY OR AS PHYSICIAN INSTRUCTED (E119 NIDDM)   testosterone cypionate 200 MG/ML injection Commonly known as:  DEPOTESTOSTERONE CYPIONATE Inject 200 mg into the muscle.   testosterone cypionate 200 MG/ML injection Commonly known as:  DEPOTESTOSTERONE CYPIONATE Inject 1 mL (200 mg total) into the muscle once for 1 dose.   testosterone cypionate 200 MG/ML injection Commonly known as:  DEPOTESTOSTERONE CYPIONATE Inject 1 mL (200 mg total) into the muscle once for 1 dose.   traZODone 50 MG tablet Commonly known as:  DESYREL Take 50 mg by mouth at bedtime.       Allergies  Allergen Reactions  . Grapefruit Extract Other (See Comments)    Blisters all over body Blisters all over body  . Lemon Oil Other (See Comments)    Blisters all over body Blisters all over body  . Lime, Sulfurated Other (See Comments)    Blisters all over body Blisters all over body  . Orange (Diagnostic) Other (See Comments)    Blisters all over body  . Orange Oil     Blisters all over body  . Penicillin  G     Syncope, rash  . Penicillins   . Topiramate Other (See Comments)   Review of systems: Review of systems negative except as noted in HPI / PMHx or noted below: Constitutional: Negative.  HENT: Negative.   Eyes: Negative.  Respiratory: Negative.   Cardiovascular: Negative.  Gastrointestinal: Negative.  Genitourinary: Negative.  Musculoskeletal: Negative.  Neurological: Negative.  Endo/Heme/Allergies: Negative.  Cutaneous: Negative.  Past Medical History:  Diagnosis Date  . Asthma   . Hypertension   . Urticaria     Family History  Problem Relation Age of Onset  . Cancer Mother   . Hypertension Mother   . Asthma Father   . Hypertension Father   . Hearing loss Father   . Hypertension Brother   . Angioedema Neg Hx   . Allergic rhinitis Neg Hx   . Eczema Neg Hx   . Immunodeficiency Neg Hx   . Urticaria Neg Hx     Social History   Socioeconomic History  . Marital status: Married    Spouse name: Not on file  . Number of children: Not on file  . Years of education: Not on file  . Highest education level: Not on file  Occupational History  . Not on file  Social Needs  . Financial resource strain: Not on file  . Food insecurity:    Worry: Not on file    Inability: Not on file  . Transportation needs:    Medical: Not on file    Non-medical: Not on file  Tobacco Use  . Smoking status: Never Smoker  . Smokeless tobacco: Current User    Types: Chew  Substance and Sexual Activity  . Alcohol use: Yes    Alcohol/week: 0.0 standard drinks  . Drug use: No  . Sexual activity: Not on file  Lifestyle  . Physical activity:    Days per week: Not on file    Minutes per session: Not on file  . Stress: Not on file  Relationships  . Social connections:    Talks on phone: Not on file    Gets together: Not on file    Attends religious service: Not on file    Active member of club or organization: Not on file    Attends meetings of clubs or organizations: Not on  file    Relationship status: Not on file  . Intimate partner violence:    Fear of current or ex partner: Not on file    Emotionally abused: Not on file    Physically abused: Not on file    Forced sexual activity: Not on file  Other Topics Concern  . Not on file  Social History Narrative  . Not on file    I appreciate the opportunity to take part in Kassim's care. Please do not hesitate to contact me with questions.  Sincerely,   R. Jorene Guest, MD

## 2018-05-11 NOTE — Patient Instructions (Addendum)
Moderate persistent asthma  Prednisone has been provided, 40 mg x3 days, 20 mg x1 day, 10 mg x1 day, then stop.  For now, and during respiratory tract infections and asthma flares, increase Qvar 80 g to 3 inhalations 3 times per day.  Once symptoms have returned to baseline, may resume previous dose of 2 inhalations twice a day.  Continue montelukast 10 mg daily at bedtime and albuterol HFA, 1 to 2 inhalations every 4-6 hours if needed.  The patient has been asked to contact me if his symptoms persist or progress. Otherwise, he may return for follow up in 5 months.  Acute sinusitis  Prednisone has been provided (as above).  During sinus infections, use fluticasone nasal spray twice daily.  In addition, during sinus infections add azelastine nasal spray, 2 sprays per nostril twice daily.  A prescription has been provided.  Nasal saline spray (i.e., Simply Saline) or nasal saline lavage (i.e., NeilMed) is recommended as needed and prior to medicated nasal sprays.  For thick post nasal drainage, add guaifenesin 1200 mg (Mucinex Maximum Strength)  twice daily as needed with adequate hydration as discussed.  Allergic rhinitis  Continue appropriate allergen avoidance measures.  Montelukast, fluticasone nasal spray, and azelastine nasal spray (as above).  Food allergy  Continue meticulous avoidance of oranges, lemons, lime, and grapefruit and have access to epinephrine autoinjector 2 pack in case of accidental ingestion.  Food allergy action plan is in place.   Return in about 5 months (around 10/10/2018), or if symptoms worsen or fail to improve.

## 2018-05-19 ENCOUNTER — Other Ambulatory Visit: Payer: Self-pay | Admitting: Family Medicine

## 2018-05-19 ENCOUNTER — Telehealth: Payer: Self-pay | Admitting: Allergy and Immunology

## 2018-05-19 MED ORDER — MONTELUKAST SODIUM 10 MG PO TABS: 10.0000 mg | ORAL_TABLET | Freq: Every day | ORAL | 5 refills | Status: DC

## 2018-05-19 MED ORDER — ALBUTEROL SULFATE HFA 108 (90 BASE) MCG/ACT IN AERS: 2.0000 | INHALATION_SPRAY | RESPIRATORY_TRACT | 1 refills | Status: DC | PRN

## 2018-05-19 MED ORDER — FLUTICASONE PROPIONATE 50 MCG/ACT NA SUSP: NASAL | 1 refills | Status: DC

## 2018-05-19 MED ORDER — AZELASTINE HCL 0.1 % NA SOLN: 1.0000 | Freq: Two times a day (BID) | NASAL | 5 refills | Status: DC | PRN

## 2018-05-19 MED ORDER — FLUTICASONE PROPIONATE HFA 110 MCG/ACT IN AERO: 2.0000 | INHALATION_SPRAY | Freq: Two times a day (BID) | RESPIRATORY_TRACT | 5 refills | Status: DC

## 2018-05-19 NOTE — Telephone Encounter (Signed)
LM for patient via interpreter.

## 2018-05-19 NOTE — Telephone Encounter (Signed)
Copied from CRM [id number]. Topic: Quick Communication - Rx Refill/Question >> May 19, 2018  3:19 PM Mickel BaasMcGee, Ladavia Lindenbaum B, NT wrote: **Patient [facility], but he was told that is illegal and they could not do that. Patient would like all medications transferred to [facility].**  Medication: chlorthalidone (HYGROTON) 25 MG tablet testosterone cypionate (DEPOTESTOSTERONE CYPIONATE) 200 MG/ML injection metFORMIN (GLUCOPHAGE) 500 MG tablet citalopram (CELEXA) 40 MG tablet  Has the patient contacted their pharmacy? Yes. (Agent: If no, request that the patient contact the pharmacy for the refill.) (Agent: If yes, when and what did the pharmacy advise?)  Preferred Pharmacy (with phone number or street name[place] [facility] [place] [phone]  Agent: Please be advised that RX refills may take up to 3 business days. We ask that you follow-up with your pharmacy.

## 2018-05-19 NOTE — Telephone Encounter (Signed)
Spoke with Patient and he would like all his medication sent to the [facility] and placed on hold so that when he needs them he can call the [facility] and get them.He would also like to start getting his allergy injections in [place] I informed patient that I would send his medications to [facility] and place them on hold until he needs them. I also informed patient that I would transfer his vials to [facility] on Thursday and he could start going to [facility] for his injections.

## 2018-05-19 NOTE — Telephone Encounter (Signed)
Luis Richardson called via sign language interpreter.   He would like his medications [facility] to [facility].  Also Luis Richardson would like to start coming to [facility] office for his allergy injections.  I advised we should be able to get them to [facility] office this week but to call first to make sure [facility] has them. He has been sick so he will call when he feels better.

## 2018-05-21 ENCOUNTER — Ambulatory Visit (INDEPENDENT_AMBULATORY_CARE_PROVIDER_SITE_OTHER): Payer: Medicare Other | Admitting: *Deleted

## 2018-05-21 DIAGNOSIS — J309 Allergic rhinitis, unspecified: Secondary | ICD-10-CM

## 2018-05-21 MED ORDER — CHLORTHALIDONE 25 MG PO TABS: 25.0000 mg | ORAL_TABLET | Freq: Every day | ORAL | 0 refills | Status: DC

## 2018-05-21 NOTE — Telephone Encounter (Signed)
Requested medication (s) are due for refill today: Yes  Requested medication (s) are on the active medication list: Yes  Last refill:  Celexa 02/12/15; Testosterone 2018; Metformin 05/19/15  Future visit scheduled: Yes  Notes to clinic:  Unable to refill per protocol, last refilled by another provider, failed protocol     Requested Prescriptions  Pending Prescriptions Disp Refills   testosterone cypionate (DEPOTESTOSTERONE CYPIONATE) 200 MG/ML injection 10 mL     Sig: Inject 1 mL (200 mg total) into the muscle.     Off-Protocol Failed - 05/20/2018  7:56 AM      Failed - Medication not assigned to a protocol, review manually.      Passed - Valid encounter within last 12 months    Recent Outpatient Visits          1 month ago Encounter for monitoring testosterone replacement therapy   [facility], Malka So, MD   1 month ago Essential hypertension   [facility] at [facility], Malka So, MD   1 month ago Testosterone deficiency   Therapist, [facility] at [facility], Malka So, MD   3 months ago Abnormal chest x-ray   Therapist, music at [facility], [place], NP   3 months ago Testosterone deficiency   Therapist, music at [facility], Malka So, MD      Future Appointments            In 3 months [place], Malka So, MD [place] [facility] [place], [place]          metFORMIN (GLUCOPHAGE) 500 MG tablet       Endocrinology:  Diabetes - Biguanides Passed - 05/20/2018  7:56 AM      Passed - Cr in normal range and within 360 days    Creatinine, Ser  Date Value Ref Range Status  04/06/2018 1.03 0.40 - 1.50 mg/dL Final         Passed - HBA1C is between 0 and 7.9 and within 180 days    Hgb A1c MFr Bld  Date Value Ref Range Status  04/06/2018 6.1 4.6 - 6.5 % Final    Comment:    Glycemic Control Guidelines for People with Diabetes:Non Diabetic:  <6%Goal of Therapy: <7%Additional Action Suggested:  >8%          Passed -  eGFR in normal range and within 360 days    GFR  Date Value Ref Range Status  04/06/2018 78.94 >60.00 mL/min Final         Passed - Valid encounter within last 6 months    Recent Outpatient Visits          1 month ago Encounter for monitoring testosterone replacement therapy   Therapist, music at [facility], Malka So, MD   1 month ago Essential hypertension   Therapist, music at [facility], Malka So, MD   1 month ago Testosterone deficiency   Therapist, music at [facility], Malka So, MD   3 months ago Abnormal chest x-ray   [employer] at [facility], [place], NP   3 months ago Testosterone deficiency   Therapist, music at [facility], Malka So, MD      Future Appointments            In 3 months [place], Malka So, MD [place] at [place], [place]          citalopram (CELEXA) 40 MG tablet      Sig: Take 1 tablet (40 mg total) by  mouth daily.     Psychiatry:  Antidepressants - SSRI Failed - 05/20/2018  7:56 AM      Failed - Completed PHQ-2 or PHQ-9 in the last 360 days.      Passed - Valid encounter within last 6 months    Recent Outpatient Visits          1 month ago Encounter for monitoring testosterone replacement therapy   Therapist, music at [facility], Malka So, MD   1 month ago Essential hypertension   Therapist, music at [facility], Malka So, MD   1 month ago Testosterone deficiency   Therapist, music at [facility], Malka So, MD   3 months ago Abnormal chest x-ray   [employer] at [employer], [place], NP   3 months ago Testosterone deficiency   Therapist, music at [facility], Malka So, MD      Future Appointments            In 3 months [place], Malka So, MD [employer] at [place], [facility]         Signed Prescriptions Disp Refills   chlorthalidone (HYGROTON) 25 MG tablet 90 tablet 0    Sig: Take 1 tablet (25 mg total) by mouth daily.     Cardiovascular:  Diuretics - Thiazide Passed - 05/20/2018  7:56 AM      Passed - Ca in normal range and within 360 days    Calcium  Date Value Ref Range Status  04/06/2018 8.9 8.4 - 10.5 mg/dL Final         Passed - Cr in normal range and within 360 days    Creatinine, Ser  Date Value Ref Range Status  04/06/2018 1.03 0.40 - 1.50 mg/dL Final         Passed - K in normal range and within 360 days    Potassium  Date Value Ref Range Status  04/06/2018 3.5 3.5 - 5.1 mEq/L Final         Passed - Na in normal range and within 360 days    Sodium  Date Value Ref Range Status  04/06/2018 137 135 - 145 mEq/L Final         Passed - Last BP in normal range    BP Readings from Last 1 Encounters:  05/11/18 122/84         Passed - Valid encounter within last 6 months    Recent Outpatient Visits          1 month ago Encounter for monitoring testosterone replacement therapy   Therapist, music at [facility], Malka So, MD   1 month ago Essential hypertension   Therapist, music at [facility], Malka So, MD   1 month ago Testosterone deficiency   Therapist, music at [facility], Malka So, MD   3 months ago Abnormal chest x-ray   [employer] at [employer], [place], NP   3 months ago Testosterone deficiency   Therapist, music at [facility], Malka So, MD      Future Appointments            In 3 months [place], Malka So, MD [employer] at [place], [facility]

## 2018-06-12 ENCOUNTER — Encounter: Payer: Self-pay | Admitting: Allergy

## 2018-06-12 ENCOUNTER — Ambulatory Visit (INDEPENDENT_AMBULATORY_CARE_PROVIDER_SITE_OTHER): Payer: Medicare Other | Admitting: Allergy

## 2018-06-12 VITALS — BP 96/70 | HR 100 | Temp 98.5°F | Resp 20

## 2018-06-12 DIAGNOSIS — J3089 Other allergic rhinitis: Secondary | ICD-10-CM | POA: Diagnosis not present

## 2018-06-12 DIAGNOSIS — T7800XD Anaphylactic reaction due to unspecified food, subsequent encounter: Secondary | ICD-10-CM

## 2018-06-12 DIAGNOSIS — J4541 Moderate persistent asthma with (acute) exacerbation: Secondary | ICD-10-CM

## 2018-06-12 MED ORDER — MOMETASONE FURO-FORMOTEROL FUM 100-5 MCG/ACT IN AERO: INHALATION_SPRAY | RESPIRATORY_TRACT | 5 refills | Status: DC

## 2018-06-12 MED ORDER — IPRATROPIUM-ALBUTEROL 0.5-2.5 (3) MG/3ML IN SOLN: 3.0000 mL | Freq: Four times a day (QID) | RESPIRATORY_TRACT | 1 refills | Status: DC | PRN

## 2018-06-12 MED ORDER — BECLOMETHASONE DIPROP HFA 80 MCG/ACT IN AERB: 2.0000 | INHALATION_SPRAY | Freq: Two times a day (BID) | RESPIRATORY_TRACT | 5 refills | Status: DC

## 2018-06-12 NOTE — Assessment & Plan Note (Signed)
   Continue appropriate allergen avoidance measures.  Montelukast, fluticasone nasal spray 2 sprays daily, and azelastine nasal spray 1-2 sprays twice a day as needed.  Nasal saline spray (i.e., Simply Saline) or nasal saline lavage (i.e., NeilMed) is recommended as needed and prior to medicated nasal sprays.  Resume allergy injections when feeling better.

## 2018-06-12 NOTE — Patient Instructions (Addendum)
Moderate persistent asthma Start duoneb every 4-6 hours while awake for the next 3-5 days and then only use if needed.  I will review the CXR and if needed may send in antibiotics. Start prednisone taper  40mg  x 2 days, 30mg  x 2 days, 20mg  x 2 days and 10mg  x 2 days. Daily controller medication(s): Dulera 100 2 puffs twice a day with spacer and rinse mouth afterwards + Singulair 10mg  daily. Prior to physical activity: May use albuterol rescue inhaler 2 puffs 5 to 15 minutes prior to strenuous physical activities. Rescue medications: May use albuterol rescue inhaler 2 puffs or nebulizer every 4 to 6 hours as needed for shortness of breath, chest tightness, coughing, and wheezing. Monitor frequency of use.  During upper respiratory infections: Start add on Qvar 80 2 puffs twice a day for 1-2 weeks Asthma control goals:  Full participation in all desired activities (may need albuterol before activity) Albuterol use two times or less a week on average (not counting use with activity) Cough interfering with sleep two times or less a month Oral steroids no more than once a year No hospitalizations  Allergic rhinitis  Continue appropriate allergen avoidance measures.  Montelukast, fluticasone nasal spray 2 sprays daily, and azelastine nasal spray 1-2 sprays twice a day as needed.  Nasal saline spray (i.e., Simply Saline) or nasal saline lavage (i.e., NeilMed) is recommended as needed and prior to medicated nasal sprays.  Food allergy  Continue meticulous avoidance of oranges, lemons, lime, and grapefruit and have access to epinephrine autoinjector 2 pack in case of accidental ingestion.  Follow up in 4 weeks

## 2018-06-12 NOTE — Progress Notes (Signed)
Follow Up Note  RE: [name] MRN: [id number] DOB: [birth year] Date of Office Visit: 06/12/2018  Referring provider: [place], Betty G, MD Primary care provider: [place], Betty G, MD  Chief Complaint: Cough; Shortness of Breath; and Wheezing  History of Present Illness: I had the pleasure of seeing Luis Richardson for a follow up visit at the [facility] on 06/12/2018. He is a 58 y.o. male, who is being followed for asthma, allergic rhinitis, food allergies. Today he is here for new complaint of asthma symptoms. His previous allergy office visit was on 05/11/2018 with Dr. Nunzio Cobbs.   Moderate persistent asthma Patient finished prednisone from his previous visit and was doing well but about a week before Christmas he started to feel bad again with SOB, coughing, wheezing and PND. Denies any fevers but had some diaphoresis.   He went to see PCP and was given doxycycline 100mg  BID x 7 days and depo shot on 12/26 however symptoms have not improved at all. He also had a CXR last Thursday but not sure of the results.   Currently on Qvar 80 2 puffs BID, Singulair daily and using albuterol 2-3 times a day with minimal benefit. No sick contacts at home.  Allergic rhinitis Currently on Singulair daily and Flonase 2 sprays daily with some benefit.   Food allergy Avoiding oranges, lemons, lime, and grapefruit and has Epipen on hand if needed.  Assessment and Plan: [name] is a 58 y.o. male with: Moderate asthma with acute exacerbation Respiratory symptoms for the past 1 week and not improving despite IM depo and doxycyline treatment.   Today's spirometry showed: 11% improvement in FEV1 post bronchodilator treatment. Start duoneb every 4-6 hours while awake for the next 3-5 days and then only use if needed.  I will review the CXR and if needed may send in antibiotics. Start prednisone taper  40mg  x 2 days, 30mg  x 2 days, 20mg  x 2 days and 10mg  x 2 days. Daily controller  medication(s): Dulera 100 2 puffs twice a day with spacer and rinse mouth afterwards + Singulair 10mg  daily. Prior to physical activity: May use albuterol rescue inhaler 2 puffs 5 to 15 minutes prior to strenuous physical activities. Rescue medications: May use albuterol rescue inhaler 2 puffs or nebulizer every 4 to 6 hours as needed for shortness of breath, chest tightness, coughing, and wheezing. Monitor frequency of use.  During upper respiratory infections: Start add on Qvar 80 2 puffs twice a day for 1-2 weeks  Allergic rhinitis  Continue appropriate allergen avoidance measures.  Montelukast, fluticasone nasal spray 2 sprays daily, and azelastine nasal spray 1-2 sprays twice a day as needed.  Nasal saline spray (i.e., Simply Saline) or nasal saline lavage (i.e., NeilMed) is recommended as needed and prior to medicated nasal sprays.  Resume allergy injections when feeling better.   Food allergy  No accidental ingestions.   Continue meticulous avoidance of oranges, lemons, lime, and grapefruit.  For mild symptoms you can take over the counter antihistamines such as Benadryl and monitor symptoms closely. If symptoms worsen or if you have severe symptoms including breathing issues, throat closure, significant swelling, whole body hives, severe diarrhea and vomiting, lightheadedness then inject epinephrine and seek immediate medical care afterwards.  Return in about 4 weeks (around 07/10/2018).  Meds ordered this encounter  Medications  . mometasone-formoterol (DULERA) 100-5 MCG/ACT AERO    Sig: 2 puffs twice daily to prevent coughing or wheezing.    Dispense:  1 Inhaler    Refill:  5  . ipratropium-albuterol (DUONEB) 0.5-2.5 (3) MG/3ML SOLN    Sig: Take 3 mLs by nebulization every 6 (six) hours as needed.    Dispense:  90 mL    Refill:  1  . beclomethasone (QVAR REDIHALER) 80 MCG/ACT inhaler    Sig: Inhale 2 puffs into the lungs 2 (two) times daily.    Dispense:  10.6 g     Refill:  5   Diagnostics: Spirometry:  Tracings reviewed. His effort: Good reproducible efforts. FVC: 1.92L FEV1: 1.55L, 48% predicted FEV1/FVC ratio: 81% Interpretation: 11% improvement in FEV1 post bronchodilator treatment. Please see scanned spirometry results for details.  Medication List:  Current Outpatient Medications  Medication Sig Dispense Refill  . albuterol (PROAIR HFA) 108 (90 Base) MCG/ACT inhaler Inhale 2 puffs into the lungs every 4 (four) hours as needed for wheezing or shortness of breath. 1 Inhaler 1  . azelastine (ASTELIN) 0.1 % nasal spray Place 1 spray into both nostrils 2 (two) times daily as needed for rhinitis. 30 mL 5  . bacitracin-neomycin-polymyxin-hydrocortisone (CORTISPORIN) 1 % ointment Apply topically.    . cetirizine (ZYRTEC) 10 MG tablet TAKE 1 TABLET BY MOUTH EVERY DAY FOR RUNNY NOSE OR ITCHING 30 tablet 0  . chlorthalidone (HYGROTON) 25 MG tablet Take 1 tablet (25 mg total) by mouth daily. 90 tablet 0  . citalopram (CELEXA) 40 MG tablet Take 40 mg by mouth daily.    . cyanocobalamin (,VITAMIN B-12,) 1000 MCG/ML injection Inject into the muscle.    Marland Kitchen EPINEPHrine 0.3 mg/0.3 mL IJ SOAJ injection Use as directed for severe allergic reaction 1 Device 1  . fluticasone (FLONASE) 50 MCG/ACT nasal spray USE 2 SPRAYS IN EACH NOSTRIL DAILY FOR STUFFY NOSE OR DRAINAGE 48 g 1  . glucose blood (ONE TOUCH ULTRA TEST) test strip -TEST ONCE DAILY OR AS PHYSICIAN INSTRUCTED (E119 NIDDM)    . metFORMIN (GLUCOPHAGE) 500 MG tablet TAKE 1 TABLET BY MOUTH EVERY EVENING FORDIABETES    . montelukast (SINGULAIR) 10 MG tablet Take 1 tablet (10 mg total) by mouth at bedtime. 30 tablet 5  . mupirocin ointment (BACTROBAN) 2 %     . traZODone (DESYREL) 50 MG tablet Take 50 mg by mouth at bedtime.    . beclomethasone (QVAR REDIHALER) 80 MCG/ACT inhaler Inhale 2 puffs into the lungs 2 (two) times daily. 10.6 g 5  . fluticasone (FLOVENT HFA) 110 MCG/ACT inhaler Inhale 2 puffs into the  lungs 2 (two) times daily. (Patient not taking: Reported on 06/12/2018) 1 Inhaler 5  . ipratropium-albuterol (DUONEB) 0.5-2.5 (3) MG/3ML SOLN Take 3 mLs by nebulization every 6 (six) hours as needed. 90 mL 1  . mometasone-formoterol (DULERA) 100-5 MCG/ACT AERO 2 puffs twice daily to prevent coughing or wheezing. 1 Inhaler 5  . naproxen sodium (ALEVE) 220 MG tablet Take 220 mg by mouth daily as needed.    . testosterone cypionate (DEPOTESTOSTERONE CYPIONATE) 200 MG/ML injection Inject 200 mg into the muscle.    . testosterone cypionate (DEPOTESTOSTERONE CYPIONATE) 200 MG/ML injection Inject 1 mL (200 mg total) into the muscle once for 1 dose. 10 mL 0  . testosterone cypionate (DEPOTESTOSTERONE CYPIONATE) 200 MG/ML injection Inject 1 mL (200 mg total) into the muscle once for 1 dose. 10 mL 0   Current Facility-Administered Medications  Medication Dose Route Frequency Provider Last Rate Last Dose  . testosterone cypionate (DEPOTESTOTERONE CYPIONATE) injection 200 mg  200 mg Intramuscular Q14 Days [place], Betty G, MD  200 mg at 03/05/18 1119   Allergies: Allergies  Allergen Reactions  . Grapefruit Extract Other (See Comments)    Blisters all over body Blisters all over body  . Lemon Oil Other (See Comments)    Blisters all over body Blisters all over body  . Lime, Sulfurated Other (See Comments)    Blisters all over body Blisters all over body  . Orange (Diagnostic) Other (See Comments)    Blisters all over body  . Orange Oil     Blisters all over body  . Penicillin G     Syncope, rash  . Penicillins   . Topiramate Other (See Comments)   I reviewed his past medical history, social history, family history, and environmental history and no significant changes have been reported from previous visit on 05/11/2018.  Review of Systems  Constitutional: Positive for diaphoresis. Negative for appetite change, chills, fever and unexpected weight change.  HENT: Positive for congestion and  rhinorrhea.   Eyes: Negative for itching.  Respiratory: Positive for cough, chest tightness, shortness of breath and wheezing.   Gastrointestinal: Negative for abdominal pain.  Skin: Negative for rash.  Allergic/Immunologic: Positive for environmental allergies and food allergies.  Neurological: Negative for headaches.   Objective: BP 96/70 (BP Location: Right Arm, Patient Position: Sitting, Cuff Size: Normal)   Pulse 100   Temp 98.5 F (36.9 C) (Oral)   Resp 20   SpO2 95%  There is no height or weight on file to calculate BMI. Physical Exam  Constitutional: He is oriented to person, place, and time. He appears well-developed and well-nourished.  HENT:  Head: Normocephalic and atraumatic.  Right Ear: External ear normal.  Left Ear: External ear normal.  Nose: Nose normal.  Mouth/Throat: Oropharynx is clear and moist.  Eyes: Conjunctivae and EOM are normal.  Neck: Neck supple.  Cardiovascular: Normal rate, regular rhythm and normal heart sounds. Exam reveals no gallop and no friction rub.  No murmur heard. Pulmonary/Chest: Effort normal. He has no wheezes. He has no rales.  Decreased breath sounds throughout.  Lymphadenopathy:    He has no cervical adenopathy.  Neurological: He is alert and oriented to person, place, and time.  Skin: Skin is warm. No rash noted.  Psychiatric: He has a normal mood and affect. His behavior is normal.  Nursing note and vitals reviewed.  Previous notes and tests were reviewed. The plan was reviewed with the patient/family, and all questions/concerned were addressed.  It was my pleasure to see Brynda GreathouseRandall today and participate in his care. Please feel free to contact me with any questions or concerns.  Sincerely,  Wyline MoodYoon Kim, DO Allergy & Immunology  [facility]  office: [phone] [facility] office: [phone]

## 2018-06-12 NOTE — Assessment & Plan Note (Signed)
   No accidental ingestions.   Continue meticulous avoidance of oranges, lemons, lime, and grapefruit.  For mild symptoms you can take over the counter antihistamines such as Benadryl and monitor symptoms closely. If symptoms worsen or if you have severe symptoms including breathing issues, throat closure, significant swelling, whole body hives, severe diarrhea and vomiting, lightheadedness then inject epinephrine and seek immediate medical care afterwards.

## 2018-06-12 NOTE — Assessment & Plan Note (Signed)
Respiratory symptoms for the past 1 week and not improving despite IM depo and doxycyline treatment.   Today's spirometry showed: 11% improvement in FEV1 post bronchodilator treatment. Start duoneb every 4-6 hours while awake for the next 3-5 days and then only use if needed.  I will review the CXR and if needed may send in antibiotics. Start prednisone taper  40mg  x 2 days, 30mg  x 2 days, 20mg  x 2 days and 10mg  x 2 days. Daily controller medication(s): Dulera 100 2 puffs twice a day with spacer and rinse mouth afterwards + Singulair 10mg  daily. Prior to physical activity: May use albuterol rescue inhaler 2 puffs 5 to 15 minutes prior to strenuous physical activities. Rescue medications: May use albuterol rescue inhaler 2 puffs or nebulizer every 4 to 6 hours as needed for shortness of breath, chest tightness, coughing, and wheezing. Monitor frequency of use.  During upper respiratory infections: Start add on Qvar 80 2 puffs twice a day for 1-2 weeks

## 2018-06-15 ENCOUNTER — Other Ambulatory Visit: Payer: Self-pay | Admitting: Allergy and Immunology

## 2018-07-02 ENCOUNTER — Ambulatory Visit (INDEPENDENT_AMBULATORY_CARE_PROVIDER_SITE_OTHER): Payer: Medicare Other | Admitting: *Deleted

## 2018-07-02 DIAGNOSIS — J309 Allergic rhinitis, unspecified: Secondary | ICD-10-CM | POA: Diagnosis not present

## 2018-07-09 ENCOUNTER — Ambulatory Visit (INDEPENDENT_AMBULATORY_CARE_PROVIDER_SITE_OTHER): Payer: Medicare Other

## 2018-07-09 DIAGNOSIS — J309 Allergic rhinitis, unspecified: Secondary | ICD-10-CM | POA: Diagnosis not present

## 2018-07-10 ENCOUNTER — Ambulatory Visit: Payer: Medicare Other | Admitting: Allergy

## 2018-07-13 DIAGNOSIS — I509 Heart failure, unspecified: Secondary | ICD-10-CM | POA: Insufficient documentation

## 2018-07-15 ENCOUNTER — Ambulatory Visit: Payer: Medicare Other | Admitting: Allergy and Immunology

## 2018-07-18 DIAGNOSIS — I480 Paroxysmal atrial fibrillation: Secondary | ICD-10-CM | POA: Insufficient documentation

## 2018-07-22 ENCOUNTER — Ambulatory Visit (INDEPENDENT_AMBULATORY_CARE_PROVIDER_SITE_OTHER): Payer: Medicare Other

## 2018-07-22 DIAGNOSIS — J309 Allergic rhinitis, unspecified: Secondary | ICD-10-CM | POA: Diagnosis not present

## 2018-07-23 NOTE — Progress Notes (Signed)
VIALS EXP 07-28-2019 

## 2018-07-29 DIAGNOSIS — J301 Allergic rhinitis due to pollen: Secondary | ICD-10-CM | POA: Diagnosis not present

## 2018-07-30 DIAGNOSIS — J3089 Other allergic rhinitis: Secondary | ICD-10-CM | POA: Diagnosis not present

## 2018-08-04 ENCOUNTER — Ambulatory Visit (INDEPENDENT_AMBULATORY_CARE_PROVIDER_SITE_OTHER): Payer: Medicare Other

## 2018-08-04 DIAGNOSIS — J309 Allergic rhinitis, unspecified: Secondary | ICD-10-CM

## 2018-08-17 ENCOUNTER — Telehealth: Payer: Self-pay | Admitting: Internal Medicine

## 2018-08-17 ENCOUNTER — Ambulatory Visit (INDEPENDENT_AMBULATORY_CARE_PROVIDER_SITE_OTHER): Payer: Medicare Other

## 2018-08-17 ENCOUNTER — Other Ambulatory Visit: Payer: Self-pay | Admitting: Internal Medicine

## 2018-08-17 DIAGNOSIS — J309 Allergic rhinitis, unspecified: Secondary | ICD-10-CM

## 2018-08-17 DIAGNOSIS — R918 Other nonspecific abnormal finding of lung field: Secondary | ICD-10-CM

## 2018-08-17 DIAGNOSIS — R911 Solitary pulmonary nodule: Secondary | ICD-10-CM

## 2018-08-17 NOTE — Telephone Encounter (Signed)
Aware/ fine with me

## 2018-08-28 ENCOUNTER — Ambulatory Visit (INDEPENDENT_AMBULATORY_CARE_PROVIDER_SITE_OTHER): Payer: Medicare Other | Admitting: *Deleted

## 2018-08-28 DIAGNOSIS — J309 Allergic rhinitis, unspecified: Secondary | ICD-10-CM | POA: Diagnosis not present

## 2018-09-07 ENCOUNTER — Ambulatory Visit: Payer: Medicare Other | Admitting: Family Medicine

## 2018-09-15 ENCOUNTER — Other Ambulatory Visit: Payer: Self-pay | Admitting: Family Medicine

## 2018-09-29 DIAGNOSIS — Z5181 Encounter for therapeutic drug level monitoring: Secondary | ICD-10-CM | POA: Insufficient documentation

## 2018-09-29 DIAGNOSIS — R791 Abnormal coagulation profile: Secondary | ICD-10-CM | POA: Insufficient documentation

## 2018-09-29 DIAGNOSIS — Z7901 Long term (current) use of anticoagulants: Secondary | ICD-10-CM | POA: Insufficient documentation

## 2018-10-05 ENCOUNTER — Encounter: Payer: Self-pay | Admitting: Allergy

## 2018-10-05 ENCOUNTER — Ambulatory Visit (INDEPENDENT_AMBULATORY_CARE_PROVIDER_SITE_OTHER): Payer: Medicare Other

## 2018-10-05 ENCOUNTER — Other Ambulatory Visit: Payer: Medicare Other

## 2018-10-05 DIAGNOSIS — J309 Allergic rhinitis, unspecified: Secondary | ICD-10-CM

## 2018-10-06 ENCOUNTER — Other Ambulatory Visit: Payer: Self-pay | Admitting: Allergy and Immunology

## 2018-10-20 ENCOUNTER — Ambulatory Visit (INDEPENDENT_AMBULATORY_CARE_PROVIDER_SITE_OTHER): Payer: Medicare Other

## 2018-10-20 DIAGNOSIS — J309 Allergic rhinitis, unspecified: Secondary | ICD-10-CM

## 2018-11-03 ENCOUNTER — Ambulatory Visit (INDEPENDENT_AMBULATORY_CARE_PROVIDER_SITE_OTHER): Payer: Medicare Other

## 2018-11-03 DIAGNOSIS — J309 Allergic rhinitis, unspecified: Secondary | ICD-10-CM | POA: Diagnosis not present

## 2018-11-10 ENCOUNTER — Ambulatory Visit (INDEPENDENT_AMBULATORY_CARE_PROVIDER_SITE_OTHER): Payer: Medicare Other

## 2018-11-10 DIAGNOSIS — J309 Allergic rhinitis, unspecified: Secondary | ICD-10-CM

## 2018-11-17 ENCOUNTER — Ambulatory Visit (INDEPENDENT_AMBULATORY_CARE_PROVIDER_SITE_OTHER): Payer: Medicare Other

## 2018-11-17 DIAGNOSIS — J309 Allergic rhinitis, unspecified: Secondary | ICD-10-CM

## 2018-11-24 ENCOUNTER — Ambulatory Visit (INDEPENDENT_AMBULATORY_CARE_PROVIDER_SITE_OTHER): Payer: Medicare Other

## 2018-11-24 DIAGNOSIS — J301 Allergic rhinitis due to pollen: Secondary | ICD-10-CM | POA: Diagnosis not present

## 2018-12-01 ENCOUNTER — Ambulatory Visit (INDEPENDENT_AMBULATORY_CARE_PROVIDER_SITE_OTHER): Payer: Medicare Other

## 2018-12-01 DIAGNOSIS — J301 Allergic rhinitis due to pollen: Secondary | ICD-10-CM

## 2018-12-15 ENCOUNTER — Ambulatory Visit (INDEPENDENT_AMBULATORY_CARE_PROVIDER_SITE_OTHER): Payer: Medicare Other

## 2018-12-15 DIAGNOSIS — J309 Allergic rhinitis, unspecified: Secondary | ICD-10-CM | POA: Diagnosis not present

## 2018-12-29 ENCOUNTER — Ambulatory Visit (INDEPENDENT_AMBULATORY_CARE_PROVIDER_SITE_OTHER): Payer: Medicare Other

## 2018-12-29 DIAGNOSIS — J309 Allergic rhinitis, unspecified: Secondary | ICD-10-CM | POA: Diagnosis not present

## 2019-01-12 ENCOUNTER — Ambulatory Visit (INDEPENDENT_AMBULATORY_CARE_PROVIDER_SITE_OTHER): Payer: Medicare Other

## 2019-01-12 DIAGNOSIS — J309 Allergic rhinitis, unspecified: Secondary | ICD-10-CM | POA: Diagnosis not present

## 2019-01-25 NOTE — Progress Notes (Signed)
EXP 01/26/20 

## 2019-01-26 ENCOUNTER — Ambulatory Visit (INDEPENDENT_AMBULATORY_CARE_PROVIDER_SITE_OTHER): Payer: Medicare Other

## 2019-01-26 DIAGNOSIS — J309 Allergic rhinitis, unspecified: Secondary | ICD-10-CM

## 2019-01-27 DIAGNOSIS — J301 Allergic rhinitis due to pollen: Secondary | ICD-10-CM | POA: Diagnosis not present

## 2019-01-28 DIAGNOSIS — J3089 Other allergic rhinitis: Secondary | ICD-10-CM | POA: Diagnosis not present

## 2019-02-08 ENCOUNTER — Encounter: Payer: Self-pay | Admitting: Allergy and Immunology

## 2019-02-08 ENCOUNTER — Other Ambulatory Visit: Payer: Self-pay

## 2019-02-08 ENCOUNTER — Ambulatory Visit (INDEPENDENT_AMBULATORY_CARE_PROVIDER_SITE_OTHER): Payer: Medicare Other | Admitting: Allergy and Immunology

## 2019-02-08 VITALS — BP 114/64 | HR 62 | Temp 98.0°F | Resp 16

## 2019-02-08 DIAGNOSIS — J3089 Other allergic rhinitis: Secondary | ICD-10-CM | POA: Diagnosis not present

## 2019-02-08 DIAGNOSIS — J4530 Mild persistent asthma, uncomplicated: Secondary | ICD-10-CM | POA: Diagnosis not present

## 2019-02-08 DIAGNOSIS — T7800XD Anaphylactic reaction due to unspecified food, subsequent encounter: Secondary | ICD-10-CM

## 2019-02-08 MED ORDER — ALVESCO 160 MCG/ACT IN AERS: 1.0000 | INHALATION_SPRAY | Freq: Two times a day (BID) | RESPIRATORY_TRACT | 0 refills | Status: DC

## 2019-02-08 NOTE — Patient Instructions (Addendum)
Mild persistent asthma  Continue montelukast 10 mg daily at bedtime and albuterol HFA, 1 to 2 inhalations every 4-6 hours if needed.  During respiratory tract infections or asthma flares, add Alvesco 160 g 2 inhalations via spacer device 2 times per day until symptoms have returned to baseline.  Subjective and objective measures of pulmonary function will be followed and the treatment plan will be adjusted accordingly.  Allergic rhinitis  Continue appropriate allergen avoidance measures and immunotherapy injections per protocol.  Montelukast, fluticasone nasal spray 2 sprays daily, and azelastine nasal spray 1-2 sprays twice a day as needed.  Nasal saline spray (i.e., Simply Saline) or nasal saline lavage (i.e., NeilMed) is recommended as needed and prior to medicated nasal sprays.  Food allergy  Continue meticulous avoidance of oranges, lemons, lime, and grapefruit and have access to epinephrine autoinjector 2 pack.   Return in about 5 months (around 07/11/2019), or if symptoms worsen or fail to improve.

## 2019-02-08 NOTE — Assessment & Plan Note (Signed)
   Continue montelukast 10 mg daily at bedtime and albuterol HFA, 1 to 2 inhalations every 4-6 hours if needed.  During respiratory tract infections or asthma flares, add Alvesco 160 g 2 inhalations via spacer device 2 times per day until symptoms have returned to baseline.  Subjective and objective measures of pulmonary function will be followed and the treatment plan will be adjusted accordingly.

## 2019-02-08 NOTE — Assessment & Plan Note (Signed)
   Continue appropriate allergen avoidance measures and immunotherapy injections per protocol.  Montelukast, fluticasone nasal spray 2 sprays daily, and azelastine nasal spray 1-2 sprays twice a day as needed.  Nasal saline spray (i.e., Simply Saline) or nasal saline lavage (i.e., NeilMed) is recommended as needed and prior to medicated nasal sprays.

## 2019-02-08 NOTE — Progress Notes (Signed)
Follow-up Note  RE: Luis KeysRandall Scott Pickerill MRN: [id number] DOB: 05/16/1961 Date of Office Visit: 02/08/2019  Primary care provider: Selina CooleyWitten, Bobby, MD Referring provider: No ref. provider found  History of present illness: Luis Richardson is a 58 y.o. male with persistent asthma and allergic rhinitis presenting today for a sick visit.  He was last seen in this clinic in on June 12, 2018 by Dr. Selena BattenKim. He is accompanied by a sign language interpreter who assists with the history.  During his previous visit with Dr. Selena BattenKim he was started on Dulera 100 mcg, 2 inhalations via spacer device twice daily.  He reports that he was hospitalized for CHF in February and [facility]. He reports that his asthma has been well controlled in the interval while taking montelukast daily. He is receiving immunotherapy injections and has no nasal allergy symptom complaints today, though he has been experiencing occasional ear pressure and "popping". He avoids oranges, lemons, lime, and grapefruit and does not report accidental ingestion since the previous visit.   Assessment and plan: Mild persistent asthma  Continue montelukast 10 mg daily at bedtime and albuterol HFA, 1 to 2 inhalations every 4-6 hours if needed.  During respiratory tract infections or asthma flares, add Alvesco 160 g 2 inhalations via spacer device 2 times per day until symptoms have returned to baseline.  Subjective and objective measures of pulmonary function will be followed and the treatment plan will be adjusted accordingly.  Allergic rhinitis  Continue appropriate allergen avoidance measures and immunotherapy injections per protocol.  Montelukast, fluticasone nasal spray 2 sprays daily, and azelastine nasal spray 1-2 sprays twice a day as needed.  Nasal saline spray (i.e., Simply Saline) or nasal saline lavage (i.e., NeilMed) is recommended as needed and prior to  medicated nasal sprays.  Food allergy  Continue meticulous avoidance of oranges, lemons, lime, and grapefruit and have access to epinephrine autoinjector 2 pack.   Meds ordered this encounter  Medications  . ciclesonide (ALVESCO) 160 MCG/ACT inhaler    Sig: Inhale 1 puff into the lungs 2 (two) times daily.    Dispense:  1 Inhaler    Refill:  0    Diagnostics: Spirometry reveals an FVC of 2.17 L and an FEV1 of 1.92 L (60% predicted).  FEV1 is improved today compared with previous study.  This study was performed while the patient was asymptomatic.  Please see scanned spirometry results for details.    Physical examination: Blood pressure 114/64, pulse 62, temperature 98 F (36.7 C), resp. rate 16, SpO2 98 %.  General: Alert, interactive, in no acute distress. HEENT: TMs pearly gray, turbinates moderately edematous with clear discharge, post-pharynx mildly erythematous. Neck: Supple without lymphadenopathy. Lungs: Clear to auscultation without wheezing, rhonchi or rales. CV: Normal S1, S2 without murmurs. Skin: Warm and dry, without lesions or rashes.  The following portions of the patient's history were reviewed and updated as appropriate: allergies, current medications, past family history, past medical history, past social history, past surgical history and problem list.  Allergies as of 02/08/2019      Reactions   Grapefruit Extract Other (See Comments)   Blisters all over body Blisters all over body   Lemon Oil Other (See Comments)   Blisters all over body Blisters all over body   Lime, Sulfurated Other (See Comments)   Blisters all over body Blisters all over body   Orange (diagnostic) Other (See Comments)   Blisters  all over body   Orange Oil    Blisters all over body   Penicillin G    Syncope, rash   Penicillins    Topiramate Other (See Comments)      Medication List       Accurate as of February 08, 2019 12:45 PM. If you have any questions, ask your nurse  or doctor.        STOP taking these medications   mupirocin ointment 2 % Commonly known as: BACTROBAN Stopped by: Wellington Hampshire, MD     TAKE these medications   albuterol 108 (90 Base) MCG/ACT inhaler Commonly known as: ProAir HFA Inhale 2 puffs into the lungs every 4 (four) hours as needed for wheezing or shortness of breath.   Alvesco 160 MCG/ACT inhaler Generic drug: ciclesonide Inhale 1 puff into the lungs 2 (two) times daily. Started by: Wellington Hampshire, MD   azelastine 0.1 % nasal spray Commonly known as: ASTELIN Place 1 spray into both nostrils 2 (two) times daily as needed for rhinitis.   bacitracin-neomycin-polymyxin-hydrocortisone 1 % ointment Commonly known as: CORTISPORIN Apply topically.   beclomethasone 80 MCG/ACT inhaler Commonly known as: Qvar RediHaler Inhale 2 puffs into the lungs 2 (two) times daily.   cetirizine 10 MG tablet Commonly known as: ZYRTEC TAKE 1 TABLET BY MOUTH EVERY DAY FOR RUNNY NOSE OR ITCHING   chlorthalidone 25 MG tablet Commonly known as: HYGROTON TAKE 1 TABLET BY MOUTH EVERY DAY   citalopram 40 MG tablet Commonly known as: CELEXA Take 40 mg by mouth daily.   cyanocobalamin 1000 MCG/ML injection Commonly known as: (VITAMIN B-12) Inject into the muscle.   EPINEPHrine 0.3 mg/0.3 mL Soaj injection Commonly known as: EPI-PEN Use as directed for severe allergic reaction   fluticasone 110 MCG/ACT inhaler Commonly known as: Flovent HFA Inhale 2 puffs into the lungs 2 (two) times daily.   fluticasone 50 MCG/ACT nasal spray Commonly known as: FLONASE USE 2 SPRAYS IN EACH NOSTRIL EVERY DAY FOR STUFFY NOSE OR DRAINAGE   ipratropium-albuterol 0.5-2.5 (3) MG/3ML Soln Commonly known as: DUONEB Take 3 mLs by nebulization every 6 (six) hours as needed.   metFORMIN 500 MG tablet Commonly known as: GLUCOPHAGE TAKE 1 TABLET BY MOUTH EVERY EVENING FORDIABETES   mometasone-formoterol 100-5 MCG/ACT Aero Commonly known as: Dulera  2 puffs twice daily to prevent coughing or wheezing.   montelukast 10 MG tablet Commonly known as: SINGULAIR Take 1 tablet (10 mg total) by mouth at bedtime.   naproxen sodium 220 MG tablet Commonly known as: ALEVE Take 220 mg by mouth daily as needed.   ONE TOUCH ULTRA TEST test strip Generic drug: glucose blood -TEST ONCE DAILY OR AS PHYSICIAN INSTRUCTED (E119 NIDDM)   testosterone cypionate 200 MG/ML injection Commonly known as: DEPOTESTOSTERONE CYPIONATE Inject 1 mL (200 mg total) into the muscle once for 1 dose. What changed: Another medication with the same name was removed. Continue taking this medication, and follow the directions you see here. Changed by: Wellington Hampshire, MD   traZODone 50 MG tablet Commonly known as: DESYREL Take 50 mg by mouth at bedtime.       Allergies  Allergen Reactions  . Grapefruit Extract Other (See Comments)    Blisters all over body Blisters all over body  . Lemon Oil Other (See Comments)    Blisters all over body Blisters all over body  . Lime, Sulfurated Other (See Comments)    Blisters all over body Blisters all over body  .  Orange (Diagnostic) Other (See Comments)    Blisters all over body  . Orange Oil     Blisters all over body  . Penicillin G     Syncope, rash  . Penicillins   . Topiramate Other (See Comments)    Review of systems: Review of systems negative except as noted in HPI / PMHx or noted below: Constitutional: Negative.  HENT: Negative.   Eyes: Negative.  Respiratory: Negative.   Cardiovascular: Negative.  Gastrointestinal: Negative.  Genitourinary: Negative.  Musculoskeletal: Negative.  Neurological: Negative.  Endo/Heme/Allergies: Negative.  Cutaneous: Negative.   Past Medical History:  Diagnosis Date  . Asthma   . Hypertension   . Urticaria     Family History  Problem Relation Age of Onset  . Cancer Mother   . Hypertension Mother   . Asthma Father   . Hypertension Father   . Hearing  loss Father   . Hypertension Brother   . Angioedema Neg Hx   . Allergic rhinitis Neg Hx   . Eczema Neg Hx   . Immunodeficiency Neg Hx   . Urticaria Neg Hx     Social History   Socioeconomic History  . Marital status: Married    Spouse name: Not on file  . Number of children: Not on file  . Years of education: Not on file  . Highest education level: Not on file  Occupational History  . Not on file  Social Needs  . Financial resource strain: Not on file  . Food insecurity    Worry: Not on file    Inability: Not on file  . Transportation needs    Medical: Not on file    Non-medical: Not on file  Tobacco Use  . Smoking status: Never Smoker  . Smokeless tobacco: Current User    Types: Chew  Substance and Sexual Activity  . Alcohol use: Yes    Alcohol/week: 0.0 standard drinks  . Drug use: No  . Sexual activity: Not on file  Lifestyle  . Physical activity    Days per week: Not on file    Minutes per session: Not on file  . Stress: Not on file  Relationships  . Social Herbalist on phone: Not on file    Gets together: Not on file    Attends religious service: Not on file    Active member of club or organization: Not on file    Attends meetings of clubs or organizations: Not on file    Relationship status: Not on file  . Intimate partner violence    Fear of current or ex partner: Not on file    Emotionally abused: Not on file    Physically abused: Not on file    Forced sexual activity: Not on file  Other Topics Concern  . Not on file  Social History Narrative  . Not on file    I appreciate the opportunity to take part in Whitman's care. Please do not hesitate to contact me with questions.  Sincerely,   R. Edgar Frisk, MD

## 2019-02-08 NOTE — Assessment & Plan Note (Signed)
   Continue meticulous avoidance of oranges, lemons, lime, and grapefruit and have access to epinephrine autoinjector 2 pack.

## 2019-02-09 ENCOUNTER — Ambulatory Visit (INDEPENDENT_AMBULATORY_CARE_PROVIDER_SITE_OTHER): Payer: Medicare Other

## 2019-02-09 DIAGNOSIS — J309 Allergic rhinitis, unspecified: Secondary | ICD-10-CM | POA: Diagnosis not present

## 2019-02-19 ENCOUNTER — Telehealth: Payer: Self-pay | Admitting: *Deleted

## 2019-02-19 NOTE — Telephone Encounter (Signed)
PA for Alvesco 160 mcg approved on August 31st through 06/10/19 on cover my meds.

## 2019-02-24 ENCOUNTER — Ambulatory Visit (INDEPENDENT_AMBULATORY_CARE_PROVIDER_SITE_OTHER): Payer: Medicare Other

## 2019-02-24 DIAGNOSIS — J309 Allergic rhinitis, unspecified: Secondary | ICD-10-CM | POA: Diagnosis not present

## 2019-02-26 ENCOUNTER — Other Ambulatory Visit: Payer: Self-pay | Admitting: Family Medicine

## 2019-03-02 ENCOUNTER — Ambulatory Visit (INDEPENDENT_AMBULATORY_CARE_PROVIDER_SITE_OTHER): Payer: Medicare Other

## 2019-03-02 DIAGNOSIS — J309 Allergic rhinitis, unspecified: Secondary | ICD-10-CM

## 2019-03-09 ENCOUNTER — Ambulatory Visit (INDEPENDENT_AMBULATORY_CARE_PROVIDER_SITE_OTHER): Payer: Medicare Other

## 2019-03-09 DIAGNOSIS — J309 Allergic rhinitis, unspecified: Secondary | ICD-10-CM | POA: Diagnosis not present

## 2019-03-17 ENCOUNTER — Other Ambulatory Visit: Payer: Self-pay

## 2019-03-17 ENCOUNTER — Ambulatory Visit (INDEPENDENT_AMBULATORY_CARE_PROVIDER_SITE_OTHER): Payer: Medicare Other

## 2019-03-17 DIAGNOSIS — J309 Allergic rhinitis, unspecified: Secondary | ICD-10-CM | POA: Diagnosis not present

## 2019-03-17 MED ORDER — ALBUTEROL SULFATE HFA 108 (90 BASE) MCG/ACT IN AERS: 2.0000 | INHALATION_SPRAY | RESPIRATORY_TRACT | 1 refills | Status: AC | PRN

## 2019-03-24 ENCOUNTER — Ambulatory Visit (INDEPENDENT_AMBULATORY_CARE_PROVIDER_SITE_OTHER): Payer: Medicare Other

## 2019-03-24 DIAGNOSIS — J309 Allergic rhinitis, unspecified: Secondary | ICD-10-CM | POA: Diagnosis not present

## 2019-03-31 ENCOUNTER — Ambulatory Visit (INDEPENDENT_AMBULATORY_CARE_PROVIDER_SITE_OTHER): Payer: Medicare Other

## 2019-03-31 DIAGNOSIS — J309 Allergic rhinitis, unspecified: Secondary | ICD-10-CM | POA: Diagnosis not present

## 2019-04-13 ENCOUNTER — Ambulatory Visit (INDEPENDENT_AMBULATORY_CARE_PROVIDER_SITE_OTHER): Payer: Medicare Other

## 2019-04-13 DIAGNOSIS — J309 Allergic rhinitis, unspecified: Secondary | ICD-10-CM

## 2019-04-15 ENCOUNTER — Other Ambulatory Visit: Payer: Self-pay | Admitting: *Deleted

## 2019-04-15 MED ORDER — FLUTICASONE PROPIONATE 50 MCG/ACT NA SUSP: NASAL | 5 refills | Status: DC

## 2019-04-20 ENCOUNTER — Ambulatory Visit (INDEPENDENT_AMBULATORY_CARE_PROVIDER_SITE_OTHER): Payer: Medicare Other

## 2019-04-20 DIAGNOSIS — J309 Allergic rhinitis, unspecified: Secondary | ICD-10-CM | POA: Diagnosis not present

## 2019-04-27 NOTE — Progress Notes (Signed)
VIALS EXP 04-26-20 

## 2019-04-28 ENCOUNTER — Ambulatory Visit (INDEPENDENT_AMBULATORY_CARE_PROVIDER_SITE_OTHER): Payer: Medicare Other

## 2019-04-28 DIAGNOSIS — J309 Allergic rhinitis, unspecified: Secondary | ICD-10-CM

## 2019-04-29 DIAGNOSIS — J301 Allergic rhinitis due to pollen: Secondary | ICD-10-CM | POA: Diagnosis not present

## 2019-05-04 ENCOUNTER — Ambulatory Visit (INDEPENDENT_AMBULATORY_CARE_PROVIDER_SITE_OTHER): Payer: Medicare Other

## 2019-05-04 DIAGNOSIS — J309 Allergic rhinitis, unspecified: Secondary | ICD-10-CM

## 2019-05-11 DIAGNOSIS — J3089 Other allergic rhinitis: Secondary | ICD-10-CM | POA: Diagnosis not present

## 2019-05-12 ENCOUNTER — Other Ambulatory Visit: Payer: Self-pay | Admitting: Family Medicine

## 2019-05-13 ENCOUNTER — Ambulatory Visit (INDEPENDENT_AMBULATORY_CARE_PROVIDER_SITE_OTHER): Payer: Medicare Other

## 2019-05-13 DIAGNOSIS — J309 Allergic rhinitis, unspecified: Secondary | ICD-10-CM | POA: Diagnosis not present

## 2019-05-18 ENCOUNTER — Ambulatory Visit (INDEPENDENT_AMBULATORY_CARE_PROVIDER_SITE_OTHER): Payer: Medicare Other

## 2019-05-18 DIAGNOSIS — J309 Allergic rhinitis, unspecified: Secondary | ICD-10-CM

## 2019-05-25 ENCOUNTER — Ambulatory Visit (INDEPENDENT_AMBULATORY_CARE_PROVIDER_SITE_OTHER): Payer: Medicare Other

## 2019-05-25 DIAGNOSIS — J309 Allergic rhinitis, unspecified: Secondary | ICD-10-CM

## 2019-06-09 ENCOUNTER — Ambulatory Visit (INDEPENDENT_AMBULATORY_CARE_PROVIDER_SITE_OTHER): Payer: Medicare Other

## 2019-06-09 DIAGNOSIS — J309 Allergic rhinitis, unspecified: Secondary | ICD-10-CM

## 2019-06-15 ENCOUNTER — Ambulatory Visit (INDEPENDENT_AMBULATORY_CARE_PROVIDER_SITE_OTHER): Payer: Medicare Other

## 2019-06-15 DIAGNOSIS — J309 Allergic rhinitis, unspecified: Secondary | ICD-10-CM

## 2019-06-23 ENCOUNTER — Ambulatory Visit (INDEPENDENT_AMBULATORY_CARE_PROVIDER_SITE_OTHER): Payer: Medicare Other

## 2019-06-23 DIAGNOSIS — J309 Allergic rhinitis, unspecified: Secondary | ICD-10-CM

## 2019-06-29 ENCOUNTER — Ambulatory Visit (INDEPENDENT_AMBULATORY_CARE_PROVIDER_SITE_OTHER): Payer: Medicare Other

## 2019-06-29 DIAGNOSIS — J309 Allergic rhinitis, unspecified: Secondary | ICD-10-CM | POA: Diagnosis not present

## 2019-07-06 ENCOUNTER — Ambulatory Visit (INDEPENDENT_AMBULATORY_CARE_PROVIDER_SITE_OTHER): Payer: Medicare Other

## 2019-07-06 DIAGNOSIS — J309 Allergic rhinitis, unspecified: Secondary | ICD-10-CM | POA: Diagnosis not present

## 2019-07-12 ENCOUNTER — Encounter: Payer: Self-pay | Admitting: Allergy and Immunology

## 2019-07-12 ENCOUNTER — Ambulatory Visit (INDEPENDENT_AMBULATORY_CARE_PROVIDER_SITE_OTHER): Payer: Medicare Other | Admitting: Allergy and Immunology

## 2019-07-12 ENCOUNTER — Other Ambulatory Visit: Payer: Self-pay

## 2019-07-12 VITALS — BP 130/80 | HR 70 | Temp 97.6°F | Resp 18 | Ht 67.0 in | Wt 282.0 lb

## 2019-07-12 DIAGNOSIS — J3089 Other allergic rhinitis: Secondary | ICD-10-CM

## 2019-07-12 DIAGNOSIS — I10 Essential (primary) hypertension: Secondary | ICD-10-CM | POA: Diagnosis not present

## 2019-07-12 DIAGNOSIS — J4530 Mild persistent asthma, uncomplicated: Secondary | ICD-10-CM | POA: Diagnosis not present

## 2019-07-12 DIAGNOSIS — T7800XD Anaphylactic reaction due to unspecified food, subsequent encounter: Secondary | ICD-10-CM

## 2019-07-12 MED ORDER — EPINEPHRINE 0.3 MG/0.3ML IJ SOAJ: 0.3000 mg | INTRAMUSCULAR | 2 refills | Status: AC | PRN

## 2019-07-12 NOTE — Patient Instructions (Addendum)
Mild persistent asthma  Continue montelukast 10 mg daily at bedtime and albuterol HFA, 1 to 2 inhalations every 4-6 hours if needed.  During respiratory tract infections or asthma flares, add Pulmicort flexhaler 180 g 2 inhalations 2 times per day until symptoms have returned to baseline.  Subjective and objective measures of pulmonary function will be followed and the treatment plan will be adjusted accordingly.  Allergic rhinitis  Continue appropriate allergen avoidance measures and immunotherapy injections per protocol.  Montelukast, fluticasone nasal spray 2 sprays daily, and azelastine nasal spray 1-2 sprays twice a day if needed.  Nasal saline spray (i.e., Simply Saline) or nasal saline lavage (i.e., NeilMed) is recommended as needed and prior to medicated nasal sprays.  Food allergy  Continue careful avoidance of oranges, lemons, lime, and grapefruit and have access to epinephrine autoinjector 2 pack.   Return in about 6 months (around 01/09/2020), or if symptoms worsen or fail to improve.

## 2019-07-12 NOTE — Assessment & Plan Note (Signed)
   Continue careful avoidance of oranges, lemons, lime, and grapefruit and have access to epinephrine autoinjector 2 pack.

## 2019-07-12 NOTE — Assessment & Plan Note (Signed)
   Continue appropriate allergen avoidance measures and immunotherapy injections per protocol.  Montelukast, fluticasone nasal spray 2 sprays daily, and azelastine nasal spray 1-2 sprays twice a day if needed.  Nasal saline spray (i.e., Simply Saline) or nasal saline lavage (i.e., NeilMed) is recommended as needed and prior to medicated nasal sprays.

## 2019-07-12 NOTE — Progress Notes (Signed)
Follow-up Note  RE: Luis Richardson MRN: [id number] DOB: 10/29/1960 Date of Office Visit: 07/12/2019  Primary care provider: Dineen Kid, MD Referring provider: Dineen Kid, MD  History of present illness: Luis Richardson is a 59 y.o. male with persistent asthma and allergic rhinitis presenting today for follow-up.  He was last seen in this clinic in August 2020.  He is accompanied today by his sign language interpreter who assists with the history.  He reports that he has only required albuterol rescue on 1 or 2 occasions in the interval since his previous visit.  His asthma seems to be influenced by the weather, either when it is too damp or too dry or when it is too hot or too cold.  He takes montelukast 10 mg daily and has an inhaled corticosteroid on hand for burst therapy.  His nasal allergy symptoms have been well controlled with aeroallergen immunotherapy injections as well as fluticasone nasal spray when needed.  He notes that strong aromas on occasion because frontal headaches.  He avoids citrus fruits and has access to an epinephrine autoinjector.  Assessment and plan: Mild persistent asthma  Continue montelukast 10 mg daily at bedtime and albuterol HFA, 1 to 2 inhalations every 4-6 hours if needed.  During respiratory tract infections or asthma flares, add Pulmicort flexhaler 180 g 2 inhalations 2 times per day until symptoms have returned to baseline.  Subjective and objective measures of pulmonary function will be followed and the treatment plan will be adjusted accordingly.  Allergic rhinitis  Continue appropriate allergen avoidance measures and immunotherapy injections per protocol.  Montelukast, fluticasone nasal spray 2 sprays daily, and azelastine nasal spray 1-2 sprays twice a day if needed.  Nasal saline spray (i.e., Simply Saline) or nasal saline lavage (i.e., NeilMed) is recommended as needed and prior to medicated nasal sprays.  Food  allergy  Continue careful avoidance of oranges, lemons, lime, and grapefruit and have access to epinephrine autoinjector 2 pack.   Meds ordered this encounter  Medications  . EPINEPHrine 0.3 mg/0.3 mL IJ SOAJ injection    Sig: Inject 0.3 mLs (0.3 mg total) into the muscle as needed for anaphylaxis.    Dispense:  2 each    Refill:  2    Diagnostics: Spirometry:  Normal with an FEV1 of 83% predicted. This study was performed while the patient was asymptomatic.  Please see scanned spirometry results for details.    Physical examination: Blood pressure 130/80, pulse 70, temperature 97.6 F (36.4 C), temperature source Temporal, resp. rate 18, height 5\' 7"  (1.702 m), weight 282 lb (127.9 kg), SpO2 95 %.  General: Alert, interactive, in no acute distress. HEENT: Scarring of TMs, turbinates mildly edematous without discharge, post-pharynx mildly erythematous. Neck: Supple without lymphadenopathy. Lungs: Clear to auscultation without wheezing, rhonchi or rales. CV: Normal S1, S2 without murmurs. Skin: Warm and dry, without lesions or rashes.  The following portions of the patient's history were reviewed and updated as appropriate: allergies, current medications, past family history, past medical history, past social history, past surgical history and problem list.  Current Outpatient Medications  Medication Sig Dispense Refill  . albuterol (VENTOLIN HFA) 108 (90 Base) MCG/ACT inhaler Inhale 2 puffs into the lungs every 4 (four) hours as needed for wheezing or shortness of breath. 18 g 1  . anastrozole (ARIMIDEX) 1 MG tablet Take 1 mg by mouth daily.    Marland Kitchen aspirin EC 81 MG tablet Take 81 mg by mouth daily.    Marland Kitchen  benzocaine-menthol (CHLORASEPTIC) 6-10 MG lozenge Take 1 lozenge by mouth as needed for sore throat.    . carvedilol (COREG) 3.125 MG tablet Take 3.125 mg by mouth 2 (two) times daily with a meal.    . cetirizine (ZYRTEC) 10 MG tablet TAKE 1 TABLET BY MOUTH EVERY DAY FOR RUNNY NOSE  OR ITCHING 30 tablet 0  . Cyanocobalamin (VITAMIN B-12) 2500 MCG SUBL Place under the tongue.    . cyclobenzaprine (FLEXERIL) 5 MG tablet Take 5 mg by mouth 3 (three) times daily as needed for muscle spasms.    Marland Kitchen EPINEPHrine 0.3 mg/0.3 mL IJ SOAJ injection Use as directed for severe allergic reaction 1 Device 1  . fluticasone (FLONASE) 50 MCG/ACT nasal spray USE 2 SPRAYS IN EACH NOSTRIL EVERY DAY FOR STUFFY NOSE OR DRAINAGE 48 g 5  . furosemide (LASIX) 20 MG tablet Take 20 mg by mouth.    Marland Kitchen glucose blood (ONE TOUCH ULTRA TEST) test strip -TEST ONCE DAILY OR AS PHYSICIAN INSTRUCTED (E119 NIDDM)    . ipratropium-albuterol (DUONEB) 0.5-2.5 (3) MG/3ML SOLN Take 3 mLs by nebulization every 6 (six) hours as needed. 90 mL 1  . lisinopril (ZESTRIL) 10 MG tablet Take 10 mg by mouth daily.    Marland Kitchen LORAZEPAM PO Take by mouth.    . metFORMIN (GLUCOPHAGE) 500 MG tablet TAKE 1 TABLET BY MOUTH EVERY EVENING FORDIABETES    . montelukast (SINGULAIR) 10 MG tablet Take 1 tablet (10 mg total) by mouth at bedtime. 30 tablet 5  . mupirocin ointment (BACTROBAN) 2 % Place 1 application into the nose 2 (two) times daily.    . nitroGLYCERIN (NITROSTAT) 0.4 MG SL tablet Place 0.4 mg under the tongue every 5 (five) minutes as needed for chest pain.    . rosuvastatin (CRESTOR) 10 MG tablet Take 10 mg by mouth daily.    Jeananne Rama Sulfate (VISINE-AC OP) Apply to eye.    . traZODone (DESYREL) 50 MG tablet Take 50 mg by mouth at bedtime.    . WARFARIN SODIUM PO Take by mouth.    . EPINEPHrine 0.3 mg/0.3 mL IJ SOAJ injection Inject 0.3 mLs (0.3 mg total) into the muscle as needed for anaphylaxis. 2 each 2   No current facility-administered medications for this visit.    Allergies  Allergen Reactions  . Grapefruit Extract Other (See Comments)    Blisters all over body Blisters all over body  . Lemon Oil Other (See Comments)    Blisters all over body Blisters all over body  . Lime, Sulfurated Other (See  Comments)    Blisters all over body Blisters all over body  . Orange (Diagnostic) Other (See Comments)    Blisters all over body  . Orange Oil     Blisters all over body  . Penicillin G     Syncope, rash  . Penicillins   . Topiramate Other (See Comments)    Review of systems: Review of systems negative except as noted in HPI / PMHx.   Past Medical History:  Diagnosis Date  . Asthma   . Hypertension   . Urticaria     Family History  Problem Relation Age of Onset  . Cancer Mother   . Hypertension Mother   . Asthma Father   . Hypertension Father   . Hearing loss Father   . Hypertension Brother   . Angioedema Neg Hx   . Allergic rhinitis Neg Hx   . Eczema Neg Hx   . Immunodeficiency Neg Hx   .  Urticaria Neg Hx     Social History   Socioeconomic History  . Marital status: Married    Spouse name: Not on file  . Number of children: Not on file  . Years of education: Not on file  . Highest education level: Not on file  Occupational History  . Not on file  Tobacco Use  . Smoking status: Never Smoker  . Smokeless tobacco: Current User    Types: Chew  Substance and Sexual Activity  . Alcohol use: Yes    Alcohol/week: 0.0 standard drinks  . Drug use: No  . Sexual activity: Not on file  Other Topics Concern  . Not on file  Social History Narrative  . Not on file   Social Determinants of Health   Financial Resource Strain:   . Difficulty of Paying Living Expenses: Not on file  Food Insecurity:   . Worried About Programme researcher, broadcasting/film/video in the Last Year: Not on file  . Ran Out of Food in the Last Year: Not on file  Transportation Needs:   . Lack of Transportation (Medical): Not on file  . Lack of Transportation (Non-Medical): Not on file  Physical Activity:   . Days of Exercise per Week: Not on file  . Minutes of Exercise per Session: Not on file  Stress:   . Feeling of Stress : Not on file  Social Connections:   . Frequency of Communication with Friends and  Family: Not on file  . Frequency of Social Gatherings with Friends and Family: Not on file  . Attends Religious Services: Not on file  . Active Member of Clubs or Organizations: Not on file  . Attends Banker Meetings: Not on file  . Marital Status: Not on file  Intimate Partner Violence:   . Fear of Current or Ex-Partner: Not on file  . Emotionally Abused: Not on file  . Physically Abused: Not on file  . Sexually Abused: Not on file    I appreciate the opportunity to take part in Sahid's care. Please do not hesitate to contact me with questions.  Sincerely,   R. Jorene Guest, MD

## 2019-07-12 NOTE — Assessment & Plan Note (Signed)
   Continue montelukast 10 mg daily at bedtime and albuterol HFA, 1 to 2 inhalations every 4-6 hours if needed.  During respiratory tract infections or asthma flares, add Pulmicort flexhaler 180 g 2 inhalations 2 times per day until symptoms have returned to baseline.  Subjective and objective measures of pulmonary function will be followed and the treatment plan will be adjusted accordingly.

## 2019-07-20 ENCOUNTER — Ambulatory Visit (INDEPENDENT_AMBULATORY_CARE_PROVIDER_SITE_OTHER): Payer: Medicare Other

## 2019-07-20 DIAGNOSIS — J309 Allergic rhinitis, unspecified: Secondary | ICD-10-CM | POA: Diagnosis not present

## 2019-07-21 DIAGNOSIS — J301 Allergic rhinitis due to pollen: Secondary | ICD-10-CM | POA: Diagnosis not present

## 2019-07-21 NOTE — Progress Notes (Signed)
VIALS EXP 07-20-20 

## 2019-07-22 DIAGNOSIS — J3089 Other allergic rhinitis: Secondary | ICD-10-CM | POA: Diagnosis not present

## 2019-07-23 DIAGNOSIS — E559 Vitamin D deficiency, unspecified: Secondary | ICD-10-CM | POA: Insufficient documentation

## 2019-08-03 ENCOUNTER — Ambulatory Visit (INDEPENDENT_AMBULATORY_CARE_PROVIDER_SITE_OTHER): Payer: Medicare Other

## 2019-08-03 DIAGNOSIS — J309 Allergic rhinitis, unspecified: Secondary | ICD-10-CM | POA: Diagnosis not present

## 2019-08-17 ENCOUNTER — Ambulatory Visit (INDEPENDENT_AMBULATORY_CARE_PROVIDER_SITE_OTHER): Payer: Medicare Other

## 2019-08-17 DIAGNOSIS — J309 Allergic rhinitis, unspecified: Secondary | ICD-10-CM | POA: Diagnosis not present

## 2019-09-09 ENCOUNTER — Ambulatory Visit (INDEPENDENT_AMBULATORY_CARE_PROVIDER_SITE_OTHER): Payer: Medicare Other

## 2019-09-09 DIAGNOSIS — J309 Allergic rhinitis, unspecified: Secondary | ICD-10-CM | POA: Diagnosis not present

## 2019-09-17 ENCOUNTER — Ambulatory Visit (INDEPENDENT_AMBULATORY_CARE_PROVIDER_SITE_OTHER): Payer: Medicare Other | Admitting: *Deleted

## 2019-09-17 DIAGNOSIS — J309 Allergic rhinitis, unspecified: Secondary | ICD-10-CM | POA: Diagnosis not present

## 2019-09-24 ENCOUNTER — Ambulatory Visit (INDEPENDENT_AMBULATORY_CARE_PROVIDER_SITE_OTHER): Payer: Medicare Other

## 2019-09-24 DIAGNOSIS — J309 Allergic rhinitis, unspecified: Secondary | ICD-10-CM | POA: Diagnosis not present

## 2019-10-01 ENCOUNTER — Other Ambulatory Visit: Payer: Self-pay | Admitting: Allergy

## 2019-10-01 ENCOUNTER — Other Ambulatory Visit: Payer: Self-pay

## 2019-10-01 ENCOUNTER — Encounter: Payer: Self-pay | Admitting: Allergy

## 2019-10-01 ENCOUNTER — Ambulatory Visit (INDEPENDENT_AMBULATORY_CARE_PROVIDER_SITE_OTHER): Payer: Medicare Other | Admitting: Allergy

## 2019-10-01 VITALS — BP 140/84 | HR 68 | Temp 97.5°F | Resp 20 | Ht 67.0 in | Wt 291.0 lb

## 2019-10-01 DIAGNOSIS — J4540 Moderate persistent asthma, uncomplicated: Secondary | ICD-10-CM

## 2019-10-01 DIAGNOSIS — J302 Other seasonal allergic rhinitis: Secondary | ICD-10-CM | POA: Diagnosis not present

## 2019-10-01 DIAGNOSIS — H1010 Acute atopic conjunctivitis, unspecified eye: Secondary | ICD-10-CM

## 2019-10-01 DIAGNOSIS — T7800XD Anaphylactic reaction due to unspecified food, subsequent encounter: Secondary | ICD-10-CM | POA: Diagnosis not present

## 2019-10-01 DIAGNOSIS — J3089 Other allergic rhinitis: Secondary | ICD-10-CM

## 2019-10-01 MED ORDER — FLOVENT HFA 110 MCG/ACT IN AERO
2.0000 | INHALATION_SPRAY | Freq: Two times a day (BID) | RESPIRATORY_TRACT | 2 refills | Status: DC
Start: 2019-10-01 — End: 2019-11-16

## 2019-10-01 MED ORDER — OLOPATADINE HCL 0.2 % OP SOLN
OPHTHALMIC | 5 refills | Status: DC
Start: 2019-10-01 — End: 2019-10-01

## 2019-10-01 MED ORDER — PULMICORT FLEXHALER 180 MCG/ACT IN AEPB: INHALATION_SPRAY | RESPIRATORY_TRACT | 5 refills | Status: DC

## 2019-10-01 MED ORDER — OLOPATADINE HCL 0.1 % OP SOLN: 1.0000 [drp] | Freq: Two times a day (BID) | OPHTHALMIC | 5 refills | Status: AC | PRN

## 2019-10-01 NOTE — Assessment & Plan Note (Addendum)
Currently on immunotherapy every 2 weeks for: weed, mold, dust mite, cockroach, grass and cat allergies. Last skin testing 07/03/2015.   Start olopatadine 0.2%-use 1 drop in each eye once a day as needed for itchy watery eyes.  Stop Visine eye drops   Continue fluticasone nose spray 2 sprays each nostril once a day as needed to help with nasal congestion.  Continue azelastine nose spray 1 to 2 sprays each nostril twice a day as needed for runny nose.  Continue montelukast 10 mg once a day  Continue allergy injections when symptoms are better.

## 2019-10-01 NOTE — Progress Notes (Signed)
Follow Up Note  RE: Luis Richardson MRN: [id number] DOB: [birth year] Date of Office Visit: 10/01/2019  Referring provider: Dineen Kid, MD Primary care provider: Dineen Kid, MD  Chief Complaint: Conjunctivitis (red watery eyes.)  History of Present Illness: I had the pleasure of seeing Luis Richardson for a follow up visit at the [facility] on 10/01/2019. He is a 59 y.o. male, who is being followed for moderate persistent asthma, allergic rhinitis, and food allergy. His previous allergy office visit was on June 12, 2018 with Dr. Verlin Fester. Today is a new complaint visit of itchy/watery eyes and shortness of breath.  For the past 2 to 3 weeks he reports itchy, red, watery eyes after being exposed to mold at his mom's house.  He has tried over-the-counter Visine eyedrops with minimal relief.  Patient is moving out of his mom's home next week.   For the past week he reports intermittent shortness of breath and an occasional cough that is productive.  He reports his phlegm will be green in the morning and white throughout the rest of the day.  He denies tightness in his chest and is unsure if he has any wheezing, but reports at times his friends will tell him that his breathing looks labored.  Denies any fever, chills trips to the emergency room or urgent care or use of systemic steroids since his last appointment.  He is currently only using his albuterol inhaler at this time.  He is uncertain if he is taking montelukast at this time. He is using his albuterol inhaler 1 to 2 puffs a day.  He does not have any Dulera at home but reports he does have up-to-date Pulmicort Flexhaler still at home which he has not been using.   He reports an occasional runny/stuffy nose and sneezing.  He continues on allergy injections and reports that they are helping some with his symptoms.  He denies any large local reactions due to his allergy injections.  Assessment and Plan: Luis Richardson  is a 59 y.o. male with: Not well controlled moderate persistent asthma No recent systemic steroids since last office visit. Also no visits to emergency room or urgent care. Currently using only albuterol. Uncertain if he is taking montelukast 10 mg. Spirometry completed today suggesting mild restriction. FEV1 down from previous spirometry. ACT score: 18.  Start Pulmicort Flexihaler 180 mcg 2 puffs twice a day. Rinse mouth out after with mouth wash to help prevent thrush.  Continue montelukast 10 mg- one tablet at night to help prevent cough and wheeze.  May use albuterol 2 puffs every 4 hours as needed for coughing, wheezing, tightness in chest or shortness of breath. Also, may use albuterol 2 puffs 5-15 minutes prior to exercise.  Repeat spirometry at next visit.   Seasonal and perennial allergic rhinoconjunctivitis Currently on immunotherapy every 2 weeks for: weed, mold, dust mite, cockroach, grass and cat allergies. Last skin testing 07/03/2015.   Start olopatadine 0.2%-use 1 drop in each eye once a day as needed for itchy watery eyes.  Stop Visine eye drops   Continue fluticasone nose spray 2 sprays each nostril once a day as needed to help with nasal congestion.  Continue azelastine nose spray 1 to 2 sprays each nostril twice a day as needed for runny nose.  Continue montelukast 10 mg once a day  Continue allergy injections when symptoms are better.  Food allergy Currently avoiding: Oranges, lemons, limes and grapefruit.  Continue to avoid oranges,  lemons, Lyme, and grapefruit.   In case of an allergic reaction, give Benadryl 50 mg  every 4 hours, and if life-threatening symptoms occur, inject with EpiPen 0.3 mg.  Return in about 6 weeks (around 11/12/2019), or if symptoms worsen or fail to improve.  Meds ordered this encounter  Medications  . budesonide (PULMICORT FLEXHALER) 180 MCG/ACT inhaler    Sig: Two puffs twice a day.  Rinse mouth out after with mouth wash to help  prevent thrush.    Dispense:  1 each    Refill:  5  . Olopatadine HCl (PATADAY) 0.2 % SOLN    Sig: One drop each eye once a day as needed for itchy, watery eyes.    Dispense:  2.5 mL    Refill:  5   Lab Orders  No laboratory test(s) ordered today    Diagnostics: Spirometry:  Tracings reviewed. His effort: Good reproducible efforts. FVC: 3.21L FEV1: 2.39L, 73% predicted FEV1/FVC ratio: 74% Interpretation: suggests mild restriction.  This is down some from his previous spirometry. Please see scanned spirometry results for details.  Medication List:  Current Outpatient Medications  Medication Sig Dispense Refill  . albuterol (VENTOLIN HFA) 108 (90 Base) MCG/ACT inhaler Inhale 2 puffs into the lungs every 4 (four) hours as needed for wheezing or shortness of breath. 18 g 1  . anastrozole (ARIMIDEX) 1 MG tablet Take 1 mg by mouth daily.    Marland Kitchen aspirin EC 81 MG tablet Take 81 mg by mouth daily.    . benzocaine-menthol (CHLORASEPTIC) 6-10 MG lozenge Take 1 lozenge by mouth as needed for sore throat.    . carvedilol (COREG) 3.125 MG tablet Take 3.125 mg by mouth 2 (two) times daily with a meal.    . cetirizine (ZYRTEC) 10 MG tablet TAKE 1 TABLET BY MOUTH EVERY DAY FOR RUNNY NOSE OR ITCHING 30 tablet 0  . Cyanocobalamin (VITAMIN B-12) 2500 MCG SUBL Place under the tongue.    . cyclobenzaprine (FLEXERIL) 5 MG tablet Take 5 mg by mouth 3 (three) times daily as needed for muscle spasms.    Marland Kitchen EPINEPHrine 0.3 mg/0.3 mL IJ SOAJ injection Use as directed for severe allergic reaction 1 Device 1  . EPINEPHrine 0.3 mg/0.3 mL IJ SOAJ injection Inject 0.3 mLs (0.3 mg total) into the muscle as needed for anaphylaxis. 2 each 2  . fluticasone (FLONASE) 50 MCG/ACT nasal spray USE 2 SPRAYS IN EACH NOSTRIL EVERY DAY FOR STUFFY NOSE OR DRAINAGE 48 g 5  . furosemide (LASIX) 20 MG tablet Take 20 mg by mouth.    Marland Kitchen glucose blood (ONE TOUCH ULTRA TEST) test strip -TEST ONCE DAILY OR AS PHYSICIAN INSTRUCTED (E119  NIDDM)    . ipratropium-albuterol (DUONEB) 0.5-2.5 (3) MG/3ML SOLN Take 3 mLs by nebulization every 6 (six) hours as needed. 90 mL 1  . lisinopril (ZESTRIL) 10 MG tablet Take 10 mg by mouth daily.    Marland Kitchen LORAZEPAM PO Take by mouth.    . metFORMIN (GLUCOPHAGE) 500 MG tablet TAKE 1 TABLET BY MOUTH EVERY EVENING FORDIABETES    . montelukast (SINGULAIR) 10 MG tablet Take 1 tablet (10 mg total) by mouth at bedtime. 30 tablet 5  . mupirocin ointment (BACTROBAN) 2 % Place 1 application into the nose 2 (two) times daily.    . nitroGLYCERIN (NITROSTAT) 0.4 MG SL tablet Place 0.4 mg under the tongue every 5 (five) minutes as needed for chest pain.    . rosuvastatin (CRESTOR) 10 MG tablet Take 10 mg by  mouth daily.    . tadalafil (CIALIS) 5 MG tablet 1-2 daily as needed for erectile dysfunction. Do not take within 36 hours of any nitroglycerin.    Marland Kitchen testosterone cypionate (DEPOTESTOSTERONE CYPIONATE) 200 MG/ML injection Inject 200 mg into the muscle every 14 (fourteen) days.    Jeananne Rama Sulfate (VISINE-AC OP) Apply to eye.    . traZODone (DESYREL) 50 MG tablet Take 50 mg by mouth at bedtime.    . WARFARIN SODIUM PO Take by mouth.    . budesonide (PULMICORT FLEXHALER) 180 MCG/ACT inhaler Two puffs twice a day.  Rinse mouth out after with mouth wash to help prevent thrush. 1 each 5  . Olopatadine HCl (PATADAY) 0.2 % SOLN One drop each eye once a day as needed for itchy, watery eyes. 2.5 mL 5   No current facility-administered medications for this visit.   Allergies: Allergies  Allergen Reactions  . Grapefruit Extract Other (See Comments)    Blisters all over body Blisters all over body  . Lemon Oil Other (See Comments)    Blisters all over body Blisters all over body  . Lime, Sulfurated Other (See Comments)    Blisters all over body Blisters all over body  . Orange (Diagnostic) Other (See Comments)    Blisters all over body  . Orange Oil     Blisters all over body  . Penicillin G      Syncope, rash  . Penicillins   . Sildenafil Other (See Comments)    SOB and chest discomfort   . Topiramate Other (See Comments)   I reviewed his past medical history, social history, family history, and environmental history and no significant changes have been reported from his previous visit.  Review of Systems  Constitutional: Negative for chills and fever.  Eyes: Positive for discharge and itching.  Respiratory: Positive for cough and shortness of breath.   Cardiovascular: Negative for chest pain.  Gastrointestinal: Negative for abdominal pain.  Genitourinary: Negative for difficulty urinating.  Allergic/Immunologic: Positive for environmental allergies and food allergies.   Objective: BP 140/84 (BP Location: Right Arm, Patient Position: Sitting, Cuff Size: Large)   Pulse 68   Temp (!) 97.5 F (36.4 C) (Temporal)   Resp 20   Ht 5\' 7"  (1.702 m)   Wt 291 lb 0.1 oz (132 kg)   SpO2 97%   BMI 45.58 kg/m  Body mass index is 45.58 kg/m. Physical Exam  Constitutional: He is oriented to person, place, and time. He appears well-developed and well-nourished.  HENT:  Head: Normocephalic and atraumatic.  Right Ear: External ear normal.  Left Ear: External ear normal.  Mouth/Throat: Oropharynx is clear and moist.  Nose: mildly edematous and erythematous. White, thick nasal drainage.  Eyes: Conjunctivae are normal.  Cardiovascular: Normal rate and regular rhythm.  Pulmonary/Chest: Effort normal and breath sounds normal.  Lungs clear to auscultation  Neurological: He is alert and oriented to person, place, and time.  Skin: Skin is warm and dry.  Psychiatric: He has a normal mood and affect. His behavior is normal.   Previous notes and tests were reviewed. The plan was reviewed with the patient/family, and all questions/concerned were addressed.  Thank you for the opportunity to see Luis Richardson today and participate in his care.  Please feel free to contact me with any  questions.  Brynda Greathouse, FNP [facility]  I performed a history and physical examination of the patient and discussed her  management with the Nurse Practitioner. I reviewed the Nurse Practitioner's note and agree with the documented findings and plan of care. The note in its entirety was edited by myself, including the physical exam, assessment, and plan.   It was my pleasure to see Luis Richardson today and participate in his care. Please feel free to contact me with any questions or concerns.  Sincerely,  Wyline Mood, DO Allergy & Immunology  [facility] office: [phone] [facility] office: [phone] [place] office: [phone]

## 2019-10-01 NOTE — Assessment & Plan Note (Addendum)
No recent systemic steroids since last office visit. Also no visits to emergency room or urgent care. Currently using only albuterol. Uncertain if he is taking montelukast 10 mg. Spirometry completed today suggesting mild restriction. FEV1 down from previous spirometry. ACT score: 18.  Start Pulmicort Flexihaler 180 mcg 2 puffs twice a day. Rinse mouth out after with mouth wash to help prevent thrush.  Continue montelukast 10 mg- one tablet at night to help prevent cough and wheeze.  May use albuterol 2 puffs every 4 hours as needed for coughing, wheezing, tightness in chest or shortness of breath. Also, may use albuterol 2 puffs 5-15 minutes prior to exercise.  Repeat spirometry at next visit.

## 2019-10-01 NOTE — Patient Instructions (Addendum)
Asthma Start Pulmicort Flexihaler 180 mcg 2 puffs twice a day. Rinse mouth out after with mouth wash to help prevent thrush. Continue montelukast 10 mg- one tablet at night to help prevent cough and wheeze. May use albuterol 2 puffs every 4 hours as needed for coughing, wheezing, tightness in chest or shortness of breath. Also, may use albuterol 2 puffs 5-15 minutes prior to exercise.  Allergic conjunctivitis Start olopatadine 0.2%-use 1 drop in each eye once a day as needed for itchy watery eyes. Stop Visine eye drops  Allergic rhinitis Continue fluticasone nose spray 2 sprays each nostril once a day as needed to help with nasal congestion. Continue azelastine nose spray 1 to 2 sprays each nostril twice a day as needed for runny nose. Continue montelukast 10 mg once a day Continue allergy injections when symptoms are better.  Food allergy Continue to avoid oranges, lemons, Lyme, and grapefruit.  In case of an allergic reaction, give Benadryl 50 mg  every 4 hours, and if life-threatening symptoms occur, inject with EpiPen 0.3 mg.  Continue all other current medications. Please let us know if this treatment plan is not working well.  Schedule follow up appointment in 6 weeks with Dr. Nunzio Cobbs.

## 2019-10-01 NOTE — Telephone Encounter (Signed)
Pharmacy([facility]) requesting prescription for Pataday to see if the ins will cover cost.

## 2019-10-01 NOTE — Telephone Encounter (Signed)
Terry from [facility] calling to let us know that pt.'s insurance not covering the pulmicort flexhaler, but will cover flovent hfa's or arnuity? Please can you look at prior note regarding the pataday coverage for the eye drops. thanks

## 2019-10-01 NOTE — Telephone Encounter (Signed)
Please call patient back. Sent in Flovent 110 2 puffs BID - this replaces Pulmicort.  Patient states he had Pulmicort at home though which he can use and then switch to Flovent.  I sent in olopatadine 0.1% 1 drop twice a day as apparently the 0.2% is not covered.

## 2019-10-01 NOTE — Assessment & Plan Note (Addendum)
Currently avoiding: Oranges, lemons, limes and grapefruit.  Continue to avoid oranges, lemons, Lyme, and grapefruit.   In case of an allergic reaction, give Benadryl 50 mg  every 4 hours, and if life-threatening symptoms occur, inject with EpiPen 0.3 mg.

## 2019-10-01 NOTE — Telephone Encounter (Signed)
Left a message for pt explaining this.

## 2019-10-08 ENCOUNTER — Ambulatory Visit (INDEPENDENT_AMBULATORY_CARE_PROVIDER_SITE_OTHER): Payer: Medicare Other

## 2019-10-08 DIAGNOSIS — J309 Allergic rhinitis, unspecified: Secondary | ICD-10-CM

## 2019-10-15 ENCOUNTER — Ambulatory Visit (INDEPENDENT_AMBULATORY_CARE_PROVIDER_SITE_OTHER): Payer: Medicare Other

## 2019-10-15 DIAGNOSIS — J309 Allergic rhinitis, unspecified: Secondary | ICD-10-CM

## 2019-10-22 ENCOUNTER — Ambulatory Visit (INDEPENDENT_AMBULATORY_CARE_PROVIDER_SITE_OTHER): Payer: Medicare Other

## 2019-10-22 DIAGNOSIS — J309 Allergic rhinitis, unspecified: Secondary | ICD-10-CM

## 2019-11-05 ENCOUNTER — Ambulatory Visit (INDEPENDENT_AMBULATORY_CARE_PROVIDER_SITE_OTHER): Payer: Medicare Other

## 2019-11-05 DIAGNOSIS — J309 Allergic rhinitis, unspecified: Secondary | ICD-10-CM

## 2019-11-16 ENCOUNTER — Ambulatory Visit (INDEPENDENT_AMBULATORY_CARE_PROVIDER_SITE_OTHER): Payer: Medicare Other | Admitting: Allergy and Immunology

## 2019-11-16 ENCOUNTER — Encounter: Payer: Self-pay | Admitting: Allergy and Immunology

## 2019-11-16 VITALS — BP 136/88 | HR 69 | Temp 97.5°F | Resp 16

## 2019-11-16 DIAGNOSIS — T7800XD Anaphylactic reaction due to unspecified food, subsequent encounter: Secondary | ICD-10-CM

## 2019-11-16 DIAGNOSIS — J4540 Moderate persistent asthma, uncomplicated: Secondary | ICD-10-CM | POA: Diagnosis not present

## 2019-11-16 DIAGNOSIS — J3089 Other allergic rhinitis: Secondary | ICD-10-CM | POA: Diagnosis not present

## 2019-11-16 MED ORDER — FLOVENT HFA 110 MCG/ACT IN AERO: INHALATION_SPRAY | RESPIRATORY_TRACT | 5 refills | Status: DC

## 2019-11-16 MED ORDER — EPINEPHRINE 0.3 MG/0.3ML IJ SOAJ: INTRAMUSCULAR | 1 refills | Status: DC

## 2019-11-16 MED ORDER — IPRATROPIUM-ALBUTEROL 0.5-2.5 (3) MG/3ML IN SOLN: 3.0000 mL | Freq: Four times a day (QID) | RESPIRATORY_TRACT | 1 refills | Status: AC | PRN

## 2019-11-16 MED ORDER — AZELASTINE & FLUTICASONE 137 & 50 MCG/ACT NA THPK: PACK | NASAL | 5 refills | Status: DC

## 2019-11-16 NOTE — Progress Notes (Signed)
Follow-up Note  RE: Luis Richardson MRN: [id number] DOB: [birth year] Date of Office Visit: 11/16/2019  Primary care provider: Selina Cooley, MD Referring provider: Selina Cooley, MD  History of present illness: Luis Richardson is a 59 y.o. male with persistent asthma, allergic rhinoconjunctivitis, and food allergy presenting today for follow-up.  He was last seen in this clinic by Dr. Selena Batten on October 01, 2019.  He reports that during his last visit he was started on Pulmicort Flexhaler, however he was confused as to how to use it.  He went online to figure out how to use it, however does not believe that any medication is coming out during his attempt to use it.  He continues to take montelukast 10 mg daily at bedtime.  He continues to experience occasional asthma symptoms, particularly with exercise/exertion.  He does not experience nocturnal awakenings due to lower respiratory symptoms.  He reports that despite taking fluticasone nasal spray, he still experiences occasional clear rhinorrhea.  He is receiving immunotherapy injections without reported problems or complications.  He carefully avoids oranges, lemons, limes, and grapefruit and has access to epinephrine autoinjectors.  Assessment and plan: Moderate persistent asthma  A prescription has been provided for Flovent (fluticasone) 110 g, 2 inhalations twice a day. To maximize pulmonary deposition, a spacer has been provided along with instructions for its proper administration with an HFA inhaler.  Continue montelukast 10 mg daily at bedtime.  Continue albuterol HFA, 1 to 2 inhalations every 4-6 hours if needed.  Subjective and objective measures of pulmonary function will be followed and the treatment plan will be adjusted accordingly.  Allergic rhinitis  Continue appropriate allergen avoidance measures and immunotherapy injections per protocol.  Continue montelukast 10 mg daily at bedtime.  A prescription has been  provided for azelastine/fluticasone nasal spray, 1 spray per nostril twice daily as needed. Proper nasal spray technique has been discussed and demonstrated.  If needed, add levocetirizine (Xyzal) 5 mg daily.  Food allergy  Continue careful avoidance of oranges, lemons, Lyme, and grapefruit and have access to epinephrine autoinjector 2 pack in case of accidental ingestion.  Food allergy action plan is in place.   Meds ordered this encounter  Medications  . fluticasone (FLOVENT HFA) 110 MCG/ACT inhaler    Sig: 2 puffs twice daily with spacer to prevent coughing or wheezing. Rinse,garle and spit.    Dispense:  1 Inhaler    Refill:  5  . Azelastine & Fluticasone 137 & 50 MCG/ACT THPK    Sig: 1 spray per nostril twice daily as needed    Dispense:  1 each    Refill:  5  . ipratropium-albuterol (DUONEB) 0.5-2.5 (3) MG/3ML SOLN    Sig: Take 3 mLs by nebulization every 6 (six) hours as needed.    Dispense:  90 mL    Refill:  1  . EPINEPHrine 0.3 mg/0.3 mL IJ SOAJ injection    Sig: Use as directed for severe allergic reaction    Dispense:  2 each    Refill:  1    Dispense mylan generic brand only.    Diagnostics: Spirometry reveals an FVC of 3.08 L and an FEV1 of 2.47 L (76% predicted) with an FEV1 ratio of 105%.  FEV1 is improved compared with previous study.  This study was performed while the patient was asymptomatic.  Please see scanned spirometry results for details.    Physical examination: Blood pressure 136/88, pulse 69, temperature (!) 97.5 F (36.4 C), temperature source  Oral, resp. rate 16, SpO2 97 %.  General: Alert, interactive, in no acute distress. HEENT: TMs pearly gray, turbinates mildly edematous with clear discharge, post-pharynx mildly erythematous. Neck: Supple without lymphadenopathy. Lungs: Clear to auscultation without wheezing, rhonchi or rales. CV: Normal S1, S2 without murmurs. Skin: Warm and dry, without lesions or rashes.  The following portions of  the patient's history were reviewed and updated as appropriate: allergies, current medications, past family history, past medical history, past social history, past surgical history and problem list.  Current Outpatient Medications  Medication Sig Dispense Refill  . albuterol (VENTOLIN HFA) 108 (90 Base) MCG/ACT inhaler Inhale 2 puffs into the lungs every 4 (four) hours as needed for wheezing or shortness of breath. 18 g 1  . anastrozole (ARIMIDEX) 1 MG tablet Take 1 mg by mouth daily.    Marland Kitchen aspirin EC 81 MG tablet Take 81 mg by mouth daily.    Marland Kitchen b complex vitamins tablet Take 1 tablet by mouth daily.    . budesonide (PULMICORT FLEXHALER) 180 MCG/ACT inhaler Two puffs twice a day.  Rinse mouth out after with mouth wash to help prevent thrush. 1 each 5  . carvedilol (COREG) 3.125 MG tablet Take 3.125 mg by mouth 2 (two) times daily with a meal.    . cetirizine (ZYRTEC) 10 MG tablet TAKE 1 TABLET BY MOUTH EVERY DAY FOR RUNNY NOSE OR ITCHING 30 tablet 0  . cyclobenzaprine (FLEXERIL) 5 MG tablet Take 5 mg by mouth 3 (three) times daily as needed for muscle spasms.    Marland Kitchen EPINEPHrine 0.3 mg/0.3 mL IJ SOAJ injection Inject 0.3 mLs (0.3 mg total) into the muscle as needed for anaphylaxis. 2 each 2  . EPINEPHrine 0.3 mg/0.3 mL IJ SOAJ injection Use as directed for severe allergic reaction 2 each 1  . fluticasone (FLONASE) 50 MCG/ACT nasal spray USE 2 SPRAYS IN EACH NOSTRIL EVERY DAY FOR STUFFY NOSE OR DRAINAGE 48 g 5  . glucose blood (ONE TOUCH ULTRA TEST) test strip -TEST ONCE DAILY OR AS PHYSICIAN INSTRUCTED (E119 NIDDM)    . ipratropium-albuterol (DUONEB) 0.5-2.5 (3) MG/3ML SOLN Take 3 mLs by nebulization every 6 (six) hours as needed. 90 mL 1  . lisinopril (ZESTRIL) 10 MG tablet Take 10 mg by mouth daily.    Marland Kitchen LORAZEPAM PO Take by mouth.    . metFORMIN (GLUCOPHAGE) 500 MG tablet TAKE 1 TABLET BY MOUTH EVERY EVENING FORDIABETES    . montelukast (SINGULAIR) 10 MG tablet Take 1 tablet (10 mg total) by  mouth at bedtime. 30 tablet 5  . mupirocin ointment (BACTROBAN) 2 % Place 1 application into the nose 2 (two) times daily.    . nitroGLYCERIN (NITROSTAT) 0.4 MG SL tablet Place 0.4 mg under the tongue every 5 (five) minutes as needed for chest pain.    Marland Kitchen olopatadine (PATANOL) 0.1 % ophthalmic solution Place 1 drop into both eyes 2 (two) times daily as needed (itchy/watery eyes). 5 mL 5  . tadalafil (CIALIS) 5 MG tablet 1-2 daily as needed for erectile dysfunction. Do not take within 36 hours of any nitroglycerin.    Marland Kitchen testosterone cypionate (DEPOTESTOSTERONE CYPIONATE) 200 MG/ML injection Inject 200 mg into the muscle every 14 (fourteen) days.    Bethann Humble Sulfate (VISINE-AC OP) Apply to eye.    . traZODone (DESYREL) 50 MG tablet Take 50 mg by mouth at bedtime.    . WARFARIN SODIUM PO Take by mouth.    . Azelastine & Fluticasone 137 & 50 MCG/ACT  THPK 1 spray per nostril twice daily as needed 1 each 5  . benzocaine-menthol (CHLORASEPTIC) 6-10 MG lozenge Take 1 lozenge by mouth as needed for sore throat.    . Cyanocobalamin (VITAMIN B-12) 2500 MCG SUBL Place under the tongue.    . fluticasone (FLOVENT HFA) 110 MCG/ACT inhaler 2 puffs twice daily with spacer to prevent coughing or wheezing. Rinse,garle and spit. 1 Inhaler 5  . furosemide (LASIX) 20 MG tablet Take 20 mg by mouth.    . rosuvastatin (CRESTOR) 10 MG tablet Take 10 mg by mouth daily.     No current facility-administered medications for this visit.    Allergies  Allergen Reactions  . Grapefruit Extract Other (See Comments)    Blisters all over body Blisters all over body  . Lemon Oil Other (See Comments)    Blisters all over body Blisters all over body  . Lime, Sulfurated Other (See Comments)    Blisters all over body Blisters all over body  . Orange (Diagnostic) Other (See Comments)    Blisters all over body  . Orange Oil     Blisters all over body  . Penicillin G     Syncope, rash  . Penicillins   .  Sildenafil Other (See Comments)    SOB and chest discomfort   . Topiramate Other (See Comments)   Review of systems: Review of systems negative except as noted in HPI / PMHx.  Past Medical History:  Diagnosis Date  . Allergic rhinitis   . Asthma   . Hypertension   . Urticaria     Family History  Problem Relation Age of Onset  . Cancer Mother   . Hypertension Mother   . Asthma Father   . Hypertension Father   . Hearing loss Father   . Hypertension Brother   . Angioedema Neg Hx   . Allergic rhinitis Neg Hx   . Eczema Neg Hx   . Immunodeficiency Neg Hx   . Urticaria Neg Hx     Social History   Socioeconomic History  . Marital status: Married    Spouse name: Not on file  . Number of children: Not on file  . Years of education: Not on file  . Highest education level: Not on file  Occupational History  . Not on file  Tobacco Use  . Smoking status: Never Smoker  . Smokeless tobacco: Current User    Types: Chew  Substance and Sexual Activity  . Alcohol use: Yes    Alcohol/week: 0.0 standard drinks  . Drug use: No  . Sexual activity: Not on file  Other Topics Concern  . Not on file  Social History Narrative  . Not on file   Social Determinants of Health   Financial Resource Strain:   . Difficulty of Paying Living Expenses:   Food Insecurity:   . Worried About Programme researcher, broadcasting/film/video in the Last Year:   . Barista in the Last Year:   Transportation Needs:   . Freight forwarder (Medical):   Marland Kitchen Lack of Transportation (Non-Medical):   Physical Activity:   . Days of Exercise per Week:   . Minutes of Exercise per Session:   Stress:   . Feeling of Stress :   Social Connections:   . Frequency of Communication with Friends and Family:   . Frequency of Social Gatherings with Friends and Family:   . Attends Religious Services:   . Active Member of Clubs or Organizations:   .  Attends Banker Meetings:   Marland Kitchen Marital Status:   Intimate Partner  Violence:   . Fear of Current or Ex-Partner:   . Emotionally Abused:   Marland Kitchen Physically Abused:   . Sexually Abused:     I appreciate the opportunity to take part in Jacquise's care. Please do not hesitate to contact me with questions.  Sincerely,   R. Jorene Guest, MD

## 2019-11-16 NOTE — Assessment & Plan Note (Signed)
   Continue careful avoidance of oranges, lemons, Lyme, and grapefruit and have access to epinephrine autoinjector 2 pack in case of accidental ingestion.  Food allergy action plan is in place.

## 2019-11-16 NOTE — Progress Notes (Signed)
Vials exp 11-15-20 °

## 2019-11-16 NOTE — Assessment & Plan Note (Signed)
   Continue appropriate allergen avoidance measures and immunotherapy injections per protocol.  Continue montelukast 10 mg daily at bedtime.  A prescription has been provided for azelastine/fluticasone nasal spray, 1 spray per nostril twice daily as needed. Proper nasal spray technique has been discussed and demonstrated.  If needed, add levocetirizine (Xyzal) 5 mg daily.

## 2019-11-16 NOTE — Assessment & Plan Note (Signed)
   A prescription has been provided for Flovent (fluticasone) 110 g, 2 inhalations twice a day. To maximize pulmonary deposition, a spacer has been provided along with instructions for its proper administration with an HFA inhaler.  Continue montelukast 10 mg daily at bedtime.  Continue albuterol HFA, 1 to 2 inhalations every 4-6 hours if needed.  Subjective and objective measures of pulmonary function will be followed and the treatment plan will be adjusted accordingly.

## 2019-11-16 NOTE — Patient Instructions (Addendum)
Moderate persistent asthma  A prescription has been provided for Flovent (fluticasone) 110 g, 2 inhalations twice a day. To maximize pulmonary deposition, a spacer has been provided along with instructions for its proper administration with an HFA inhaler.  Continue montelukast 10 mg daily at bedtime.  Continue albuterol HFA, 1 to 2 inhalations every 4-6 hours if needed.  Subjective and objective measures of pulmonary function will be followed and the treatment plan will be adjusted accordingly.  Allergic rhinitis  Continue appropriate allergen avoidance measures and immunotherapy injections per protocol.  Continue montelukast 10 mg daily at bedtime.  A prescription has been provided for azelastine/fluticasone nasal spray, 1 spray per nostril twice daily as needed. Proper nasal spray technique has been discussed and demonstrated.  If needed, add levocetirizine (Xyzal) 5 mg daily.  Food allergy  Continue careful avoidance of oranges, lemons, Lyme, and grapefruit and have access to epinephrine autoinjector 2 pack in case of accidental ingestion.  Food allergy action plan is in place.   Return in about 4 months (around 03/17/2020), or if symptoms worsen or fail to improve.

## 2019-11-17 DIAGNOSIS — J301 Allergic rhinitis due to pollen: Secondary | ICD-10-CM | POA: Diagnosis not present

## 2019-11-18 DIAGNOSIS — J3089 Other allergic rhinitis: Secondary | ICD-10-CM | POA: Diagnosis not present

## 2019-11-26 ENCOUNTER — Ambulatory Visit (INDEPENDENT_AMBULATORY_CARE_PROVIDER_SITE_OTHER): Payer: Medicare Other

## 2019-11-26 DIAGNOSIS — J309 Allergic rhinitis, unspecified: Secondary | ICD-10-CM | POA: Diagnosis not present

## 2019-12-15 ENCOUNTER — Ambulatory Visit (INDEPENDENT_AMBULATORY_CARE_PROVIDER_SITE_OTHER): Payer: Medicare Other

## 2019-12-15 DIAGNOSIS — J309 Allergic rhinitis, unspecified: Secondary | ICD-10-CM | POA: Diagnosis not present

## 2020-01-03 ENCOUNTER — Ambulatory Visit (INDEPENDENT_AMBULATORY_CARE_PROVIDER_SITE_OTHER): Payer: Medicare Other

## 2020-01-03 DIAGNOSIS — J309 Allergic rhinitis, unspecified: Secondary | ICD-10-CM

## 2020-01-10 ENCOUNTER — Ambulatory Visit (INDEPENDENT_AMBULATORY_CARE_PROVIDER_SITE_OTHER): Payer: Medicare Other

## 2020-01-10 DIAGNOSIS — J309 Allergic rhinitis, unspecified: Secondary | ICD-10-CM | POA: Diagnosis not present

## 2020-01-24 ENCOUNTER — Ambulatory Visit (INDEPENDENT_AMBULATORY_CARE_PROVIDER_SITE_OTHER): Payer: Medicare Other

## 2020-01-24 DIAGNOSIS — J309 Allergic rhinitis, unspecified: Secondary | ICD-10-CM

## 2020-01-31 ENCOUNTER — Ambulatory Visit (INDEPENDENT_AMBULATORY_CARE_PROVIDER_SITE_OTHER): Payer: Medicare Other

## 2020-01-31 DIAGNOSIS — J309 Allergic rhinitis, unspecified: Secondary | ICD-10-CM

## 2020-02-09 ENCOUNTER — Ambulatory Visit (INDEPENDENT_AMBULATORY_CARE_PROVIDER_SITE_OTHER): Payer: Medicare Other

## 2020-02-09 DIAGNOSIS — J309 Allergic rhinitis, unspecified: Secondary | ICD-10-CM

## 2020-02-21 ENCOUNTER — Ambulatory Visit (INDEPENDENT_AMBULATORY_CARE_PROVIDER_SITE_OTHER): Payer: Medicare Other

## 2020-02-21 DIAGNOSIS — J309 Allergic rhinitis, unspecified: Secondary | ICD-10-CM | POA: Diagnosis not present

## 2020-02-29 ENCOUNTER — Ambulatory Visit (INDEPENDENT_AMBULATORY_CARE_PROVIDER_SITE_OTHER): Payer: Medicare Other

## 2020-02-29 DIAGNOSIS — J309 Allergic rhinitis, unspecified: Secondary | ICD-10-CM | POA: Diagnosis not present

## 2020-03-07 ENCOUNTER — Other Ambulatory Visit: Payer: Self-pay

## 2020-03-07 ENCOUNTER — Ambulatory Visit (INDEPENDENT_AMBULATORY_CARE_PROVIDER_SITE_OTHER): Payer: Medicare Other | Admitting: Family

## 2020-03-07 VITALS — BP 130/90 | HR 80 | Temp 98.3°F | Resp 20 | Ht 67.0 in | Wt 282.0 lb

## 2020-03-07 DIAGNOSIS — H1010 Acute atopic conjunctivitis, unspecified eye: Secondary | ICD-10-CM | POA: Diagnosis not present

## 2020-03-07 DIAGNOSIS — T7800XD Anaphylactic reaction due to unspecified food, subsequent encounter: Secondary | ICD-10-CM | POA: Diagnosis not present

## 2020-03-07 DIAGNOSIS — J302 Other seasonal allergic rhinitis: Secondary | ICD-10-CM

## 2020-03-07 DIAGNOSIS — J3089 Other allergic rhinitis: Secondary | ICD-10-CM

## 2020-03-07 DIAGNOSIS — J4540 Moderate persistent asthma, uncomplicated: Secondary | ICD-10-CM

## 2020-03-07 DIAGNOSIS — I10 Essential (primary) hypertension: Secondary | ICD-10-CM

## 2020-03-07 MED ORDER — ALBUTEROL SULFATE (2.5 MG/3ML) 0.083% IN NEBU
2.5000 mg | INHALATION_SOLUTION | RESPIRATORY_TRACT | 1 refills | Status: AC | PRN
Start: 2020-03-07 — End: ?

## 2020-03-07 NOTE — Patient Instructions (Addendum)
Moderate persistent asthma Start prednisone 10 mg take 1 tablet twice a day for 4 days, then take 1 tablet on the fifth day and stop. Monitor blood sugar while on prednisone Start Pumicort flexihaler 180 mcg 2 puffs twice a day to help prevent cough and wheeze. Use this every day Continue montelukast 10 mg once a day to help prevent cough and wheeze May use albuterol 2 puffs every 4 hours as needed for cough, wheeze, tightness in chest, or shortness of breath.  Also, may use albuterol 2 puffs 5 to 15 minutes prior to exercise. Asthma control goals:   Full participation in all desired activities (may need albuterol before activity)  Albuterol use two time or less a week on average (not counting use with activity)  Cough interfering with sleep two time or less a month  Oral steroids no more than once a year  No hospitalizations  Allergic rhinitis Continue immunotherapy injections as per protocol Continue avoidance measures to weed, mold, dust mite, cockroach, cat, grass Continue montelukast 10 mg once a day Continue azelastine/fluticasone nasal spray 1 spray each nostril twice a day as needed for runny stuffy nose Continue levocetirizine 5 mg once a day as needed for runny nose or itching  Food allergy Avoid oranges, lemons, lime, grape, and grapefruit. In case of an allergic reaction, give Benadryl 4 teaspoonfuls every 4 hours, and if life-threatening symptoms occur, inject with EpiPen 0.3 mg. Symjepi 0.3 mg/0.3 ml sample given with instruction on how to use  Schedule an appointment with your primary care physician to discuss your elevated blood pressure  Please let us know if this treatment plan is not working well for you. Schedule follow-up appointment in 2-3 months

## 2020-03-07 NOTE — Progress Notes (Addendum)
[address] [place] [address] Dept: [id number]  FOLLOW UP NOTE  Patient ID: Luis Richardson, male    DOB: 24-Jul-1960  Age: 59 y.o. MRN: [id number] Date of Office Visit: 03/07/2020  Assessment  Chief Complaint: Cough, Wheezing, and Shortness of Breath  HPI Luis Richardson is a 59 year old male who presents today for an acute visit.  He was last seen on November 16, 2019 by Dr. Nunzio Cobbs for moderate persistent asthma, allergic rhinitis, and food allergy.  Moderate persistent asthma is reported as not well controlled with as needed Pulmicort 180 mcg, montelukast 10 mg,and albuterol as needed.  He is not using Flovent due to it being too expensive.  He reported last week he started having a productive cough with clear sputum, chest tightness, wheezing, and shortness of breath with exertion. He denies any fever.  Yesterday these symptoms got worse.  He just recently found his albuterol inhaler and used it 2 times.  He found some expired albuterol and ipratropium bromide via his nebulizer that helped some yesterday, but it did not help this morning.  He is requesting a new nebulizer machine due to his old machine getting hot with use.  Since his last office visit he has not made any visits to the emergency room or urgent care steroids due to breathing problems.  Allergic rhinitis is reported as moderately controlled with montelukast 10 mg once a day and azelastine and fluticasone nasal spray.  He reports occasional clear rhinorrhea, nasal congestion, and postnasal drip.  He continues to receive allergy injections and feels that they are helping with his symptoms.  He denies any large local reactions.  He continues to avoid oranges, lemons, lime, and grapes.  He reports that it is grapes  and not grapefruit.  He has not had any accidental ingestion and reports that he does not have an epinephrine autoinjector device that is up-to-date due to the cost.  Current medications are as listed  in the chart  Drug Allergies:  Allergies  Allergen Reactions  . Grapefruit Extract Other (See Comments)    Blisters all over body Blisters all over body  . Grapeseed Extract [Nutritional Supplements]     Fresh grapes lips swells up  . Lemon Oil Other (See Comments)    Blisters all over body Blisters all over body  . Lime, Sulfurated Other (See Comments)    Blisters all over body Blisters all over body  . Orange (Diagnostic) Other (See Comments)    Blisters all over body  . Orange Oil     Blisters all over body  . Penicillin G     Syncope, rash  . Penicillins   . Sildenafil Other (See Comments)    SOB and chest discomfort   . Topiramate Other (See Comments)    Review of Systems: Review of Systems  Constitutional: Negative for chills and fever.  HENT:       Reports occasional nasal congestion, clear rhinorrhea, and post nasal drip  Eyes:       Reports occasional itchy watery eyes  Respiratory: Positive for cough, shortness of breath and wheezing.   Gastrointestinal: Negative for abdominal pain and heartburn.  Genitourinary: Negative for dysuria.  Skin: Negative for itching and rash.  Endo/Heme/Allergies: Positive for environmental allergies.    Physical Exam: BP 130/90 (BP Location: Right Arm, Patient Position: Sitting, Cuff Size: Large)   Pulse 80   Temp 98.3 F (36.8 C) (Tympanic)   Resp 20   Ht 5\' 7"  (  1.702 m)   Wt 282 lb (127.9 kg)   SpO2 97%   BMI 44.17 kg/m    Physical Exam Constitutional:      Appearance: He is well-developed.  HENT:     Head: Normocephalic and atraumatic.     Mouth/Throat:     Mouth: Mucous membranes are moist.     Pharynx: Oropharynx is clear.  Eyes:     Comments: Conjunctiva clear  Cardiovascular:     Rate and Rhythm: Normal rate and regular rhythm.  Pulmonary:     Effort: Pulmonary effort is normal.     Breath sounds: Normal breath sounds.     Comments: Lungs clear to auscultation Skin:    General: Skin is warm.    Neurological:     Mental Status: He is alert and oriented to person, place, and time.  Psychiatric:        Mood and Affect: Mood normal.        Behavior: Behavior normal.     Diagnostics: FVC 3.04 L, FEV1 2.33 L.  Predicted FVC 4.30 L, FEV1 3.26 L.  Spirometry indicates mild restriction.  Status post bronchodilator response shows FVC 3.29 L, FEV1 2.54 L.  Spirometry indicates mild restriction with a 9% change in FEV1.  Assessment and Plan: 1. Not well controlled moderate persistent asthma   2. Seasonal and perennial allergic rhinoconjunctivitis   3. Allergic conjunctivitis, unspecified laterality   4. Anaphylactic shock due to food, subsequent encounter   5. Essential hypertension     Meds ordered this encounter  Medications  . albuterol (PROVENTIL) (2.5 MG/3ML) 0.083% nebulizer solution    Sig: Take 3 mLs (2.5 mg total) by nebulization every 4 (four) hours as needed for wheezing or shortness of breath.    Dispense:  75 mL    Refill:  1    Patient Instructions  Moderate persistent asthma Start prednisone 10 mg take 1 tablet twice a day for 4 days, then take 1 tablet on the fifth day and stop. Monitor blood sugar while on prednisone Start Pumicort flexihaler 180 mcg 2 puffs twice a day to help prevent cough and wheeze. Use this every day Continue montelukast 10 mg once a day to help prevent cough and wheeze May use albuterol 2 puffs every 4 hours as needed for cough, wheeze, tightness in chest, or shortness of breath.  Also, may use albuterol 2 puffs 5 to 15 minutes prior to exercise. Asthma control goals:   Full participation in all desired activities (may need albuterol before activity)  Albuterol use two time or less a week on average (not counting use with activity)  Cough interfering with sleep two time or less a month  Oral steroids no more than once a year  No hospitalizations  Allergic rhinitis Continue immunotherapy injections as per protocol Continue  avoidance measures to weed, mold, dust mite, cockroach, cat, grass Continue montelukast 10 mg once a day Continue azelastine/fluticasone nasal spray 1 spray each nostril twice a day as needed for runny stuffy nose Continue levocetirizine 5 mg once a day as needed for runny nose or itching  Food allergy Avoid oranges, lemons, lime, grape, and grapefruit. In case of an allergic reaction, give Benadryl 4 teaspoonfuls every 4 hours, and if life-threatening symptoms occur, inject with EpiPen 0.3 mg. Symjepi 0.3 mg/0.3 ml sample given with instruction on how to use  Schedule an appointment with your primary care physician to discuss your elevated blood pressure  Please let us know if this treatment  plan is not working well for you. Schedule follow-up appointment in 2-3 months    Return in about 3 months (around 06/06/2020), or if symptoms worsen or fail to improve.    Thank you for the opportunity to care for this patient.  Please do not hesitate to contact me with questions.  Nehemiah Settle, FNP [facility]  ________________________________________________  I have provided oversight concerning Wynona Canes Luis Richardson's evaluation and treatment of this patient's health issues addressed during today's encounter.  I agree with the assessment and therapeutic plan as outlined in the note.   Signed,   R Jorene Guest, MD

## 2020-03-21 ENCOUNTER — Ambulatory Visit (INDEPENDENT_AMBULATORY_CARE_PROVIDER_SITE_OTHER): Payer: Medicare Other | Admitting: Allergy and Immunology

## 2020-03-21 ENCOUNTER — Encounter: Payer: Self-pay | Admitting: Allergy and Immunology

## 2020-03-21 ENCOUNTER — Other Ambulatory Visit: Payer: Self-pay

## 2020-03-21 VITALS — BP 130/82 | HR 82 | Temp 97.3°F | Resp 18

## 2020-03-21 DIAGNOSIS — T7800XD Anaphylactic reaction due to unspecified food, subsequent encounter: Secondary | ICD-10-CM

## 2020-03-21 DIAGNOSIS — J4540 Moderate persistent asthma, uncomplicated: Secondary | ICD-10-CM | POA: Diagnosis not present

## 2020-03-21 DIAGNOSIS — J3089 Other allergic rhinitis: Secondary | ICD-10-CM | POA: Diagnosis not present

## 2020-03-21 MED ORDER — FLOVENT HFA 110 MCG/ACT IN AERO: 2.0000 | INHALATION_SPRAY | Freq: Two times a day (BID) | RESPIRATORY_TRACT | 5 refills | Status: AC

## 2020-03-21 NOTE — Progress Notes (Signed)
Follow-up Note  RE: Luis Richardson MRN: [id number] DOB: [birth year] Date of Office Visit: 03/21/2020  Primary care provider: Selina Cooley, MD Referring provider: Selina Cooley, MD  History of present illness: Luis Richardson is a 59 y.o. male with persistent asthma, allergic rhinitis, and food allergy presented today for follow-up.  He was last seen in this clinic on 2020-02-28.  Hospitalized on October 5 with dyspnea.  He notes that his Lasix had been stopped in March of this year and restarted recent hospitalization.  He was found to be in atrial fibrillation and currently is wearing a cardiac monitor.  He continues to experience dyspnea with mild exertion.  He is scheduled to see a cardiologist on November 23.  He denies wheezing and coughing.  He denies lower respiratory symptoms at rest and denies nocturnal awakenings due to lower respiratory symptoms.  He had trouble with the Pulmicort device and therefore never used it.  He is taking montelukast 10 mg daily at bedtime.  He occasionally uses albuterol rescue and states that it seems to provide some benefit if his symptoms are "really bad." He reports that his nasal allergy symptoms have been well controlled with fluticasone nasal spray as needed.  He avoids orange, lemon, lime, grape, and grapefruit and has access to epinephrine autoinjectors.  Assessment and plan: Moderate persistent asthma The patient's history suggests shortness of breath as a result of cardiac etiology, as well as underlying asthma.  A prescription has been provided for Flovent (fluticasone) 110 g, 2 inhalations via spacer device twice a day.  Continue montelukast 10 mg daily at bedtime.  Continue albuterol HFA, 1 to 2 inhalations every 4-6 hours if needed.  Subjective and objective measures of pulmonary function will be followed and the treatment plan will be adjusted accordingly.  The patient is scheduled for evaluation with cardiology in  November.  Allergic rhinitis  Continue appropriate allergen avoidance measures and immunotherapy injections per protocol.  Continue montelukast 10 mg daily at bedtime.  Continue fluticasone nasal spray, 2 sprays per nostril daily if needed.  Nasal saline spray (i.e. Simply Saline) is recommended prior to medicated nasal sprays and as needed.  Continue cetirizine (Zyrtec) 10 mg daily if needed.  Food allergy  Continue careful avoidance of oranges, lemons, lime, and grapefruit and have access to epinephrine autoinjector 2 pack in case of accidental ingestion.  Food allergy action plan is in place.   Meds ordered this encounter  Medications  . fluticasone (FLOVENT HFA) 110 MCG/ACT inhaler    Sig: Inhale 2 puffs into the lungs 2 (two) times daily. Use with spacer. Rinse, gargle and spit out after use.    Dispense:  1 each    Refill:  5    Diagnostics: Spirometry reveals an FVC of 3.00 L and an FEV1 of 2.32 L (71% predicted) with an FEV1 ratio of 101%.  This study is consistent with his previous study.  This study was performed while the patient was asymptomatic.  Please see scanned spirometry results for details.    Physical examination: Blood pressure 130/82, pulse 82, temperature (!) 97.3 F (36.3 C), temperature source Temporal, resp. rate 18, SpO2 97 %.  General: Alert, interactive, in no acute distress. HEENT: TMs pearly gray, turbinates mildly edematous without discharge, post-pharynx unremarkable. Neck: Supple without lymphadenopathy. Lungs: Clear to auscultation without wheezing, rhonchi or rales. CV: Irregular vs PVCs, without murmurs. Skin: Warm and dry, without lesions or rashes.  The following portions of the patient's history were  reviewed and updated as appropriate: allergies, current medications, past family history, past medical history, past social history, past surgical history and problem list.  Current Outpatient Medications  Medication Sig Dispense  Refill  . albuterol (PROVENTIL) (2.5 MG/3ML) 0.083% nebulizer solution Take 3 mLs (2.5 mg total) by nebulization every 4 (four) hours as needed for wheezing or shortness of breath. 75 mL 1  . albuterol (VENTOLIN HFA) 108 (90 Base) MCG/ACT inhaler Inhale 2 puffs into the lungs every 4 (four) hours as needed for wheezing or shortness of breath. 18 g 1  . anastrozole (ARIMIDEX) 1 MG tablet Take 1 mg by mouth daily.    Marland Kitchen aspirin EC 81 MG tablet Take 81 mg by mouth daily.    Marland Kitchen b complex vitamins tablet Take 1 tablet by mouth daily.    . budesonide (PULMICORT FLEXHALER) 180 MCG/ACT inhaler Two puffs twice a day.  Rinse mouth out after with mouth wash to help prevent thrush. (Patient taking differently: Inhale 2 puffs into the lungs as needed. Two puffs twice a day.  Rinse mouth out after with mouth wash to help prevent thrush.) 1 each 5  . carvedilol (COREG) 3.125 MG tablet Take 3.125 mg by mouth 2 (two) times daily with a meal.    . cetirizine (ZYRTEC) 10 MG tablet TAKE 1 TABLET BY MOUTH EVERY DAY FOR RUNNY NOSE OR ITCHING 30 tablet 0  . Cyanocobalamin (VITAMIN B-12) 2500 MCG SUBL Place under the tongue.    Marland Kitchen EPINEPHrine 0.3 mg/0.3 mL IJ SOAJ injection Inject 0.3 mLs (0.3 mg total) into the muscle as needed for anaphylaxis. 2 each 2  . fluticasone (FLONASE) 50 MCG/ACT nasal spray USE 2 SPRAYS IN EACH NOSTRIL EVERY DAY FOR STUFFY NOSE OR DRAINAGE 48 g 5  . furosemide (LASIX) 20 MG tablet Take by mouth.    Marland Kitchen glucose blood (ONE TOUCH ULTRA TEST) test strip -TEST ONCE DAILY OR AS PHYSICIAN INSTRUCTED (E119 NIDDM)    . ipratropium-albuterol (DUONEB) 0.5-2.5 (3) MG/3ML SOLN Take 3 mLs by nebulization every 6 (six) hours as needed. 90 mL 1  . lisinopril (ZESTRIL) 10 MG tablet Take 10 mg by mouth daily.    Marland Kitchen LORAZEPAM PO Take by mouth.     . metFORMIN (GLUCOPHAGE) 500 MG tablet TAKE 1 TABLET BY MOUTH EVERY EVENING FORDIABETES    . ofloxacin (OCUFLOX) 0.3 % ophthalmic solution INSTILL 1 DROP INTO BOTH EYES DAILY  FOR 5 DAYS    . olopatadine (PATANOL) 0.1 % ophthalmic solution Place 1 drop into both eyes 2 (two) times daily as needed (itchy/watery eyes). 5 mL 5  . rosuvastatin (CRESTOR) 10 MG tablet Take 10 mg by mouth daily.    Marland Kitchen testosterone cypionate (DEPOTESTOSTERONE CYPIONATE) 200 MG/ML injection Inject 200 mg into the muscle every 14 (fourteen) days.    Jeananne Rama Sulfate (VISINE-AC OP) Apply to eye.    . traZODone (DESYREL) 50 MG tablet Take 50 mg by mouth at bedtime.    . WARFARIN SODIUM PO Take by mouth.    . fluticasone (FLOVENT HFA) 110 MCG/ACT inhaler Inhale 2 puffs into the lungs 2 (two) times daily. Use with spacer. Rinse, gargle and spit out after use. 1 each 5   No current facility-administered medications for this visit.    Allergies  Allergen Reactions  . Grapefruit Extract Other (See Comments)    Blisters all over body Blisters all over body  . Grapeseed Extract [Nutritional Supplements]     Fresh grapes lips swells up  .  Lemon Oil Other (See Comments)    Blisters all over body Blisters all over body  . Lime, Sulfurated Other (See Comments)    Blisters all over body Blisters all over body  . Orange (Diagnostic) Other (See Comments)    Blisters all over body  . Orange Oil     Blisters all over body  . Penicillin G     Syncope, rash  . Penicillins   . Sildenafil Other (See Comments)    SOB and chest discomfort   . Topiramate Other (See Comments)   Review of systems: Review of systems negative except as noted in HPI / PMHx.  Past Medical History:  Diagnosis Date  . Allergic rhinitis   . Asthma   . Hypertension   . Urticaria     Family History  Problem Relation Age of Onset  . Cancer Mother   . Hypertension Mother   . Asthma Father   . Hypertension Father   . Hearing loss Father   . Hypertension Brother   . Angioedema Neg Hx   . Allergic rhinitis Neg Hx   . Eczema Neg Hx   . Immunodeficiency Neg Hx   . Urticaria Neg Hx     Social History    Socioeconomic History  . Marital status: Married    Spouse name: Not on file  . Number of children: Not on file  . Years of education: Not on file  . Highest education level: Not on file  Occupational History  . Not on file  Tobacco Use  . Smoking status: Never Smoker  . Smokeless tobacco: Current User    Types: Chew  Vaping Use  . Vaping Use: Never used  Substance and Sexual Activity  . Alcohol use: Yes    Alcohol/week: 0.0 standard drinks  . Drug use: No  . Sexual activity: Not on file  Other Topics Concern  . Not on file  Social History Narrative  . Not on file   Social Determinants of Health   Financial Resource Strain:   . Difficulty of Paying Living Expenses: Not on file  Food Insecurity:   . Worried About Programme researcher, broadcasting/film/video in the Last Year: Not on file  . Ran Out of Food in the Last Year: Not on file  Transportation Needs:   . Lack of Transportation (Medical): Not on file  . Lack of Transportation (Non-Medical): Not on file  Physical Activity:   . Days of Exercise per Week: Not on file  . Minutes of Exercise per Session: Not on file  Stress:   . Feeling of Stress : Not on file  Social Connections:   . Frequency of Communication with Friends and Family: Not on file  . Frequency of Social Gatherings with Friends and Family: Not on file  . Attends Religious Services: Not on file  . Active Member of Clubs or Organizations: Not on file  . Attends Banker Meetings: Not on file  . Marital Status: Not on file  Intimate Partner Violence:   . Fear of Current or Ex-Partner: Not on file  . Emotionally Abused: Not on file  . Physically Abused: Not on file  . Sexually Abused: Not on file    I appreciate the opportunity to take part in Hurshel's care. Please do not hesitate to contact me with questions.  Sincerely,   R. Jorene Guest, MD

## 2020-03-21 NOTE — Patient Instructions (Addendum)
Moderate persistent asthma The patient's history suggests shortness of breath as a result of cardiac etiology, as well as underlying asthma.  A prescription has been provided for Flovent (fluticasone) 110 g, 2 inhalations via spacer device twice a day.  Continue montelukast 10 mg daily at bedtime.  Continue albuterol HFA, 1 to 2 inhalations every 4-6 hours if needed.  Subjective and objective measures of pulmonary function will be followed and the treatment plan will be adjusted accordingly.  The patient is scheduled for evaluation with cardiology in November.  Allergic rhinitis  Continue appropriate allergen avoidance measures and immunotherapy injections per protocol.  Continue montelukast 10 mg daily at bedtime.  Continue fluticasone nasal spray, 2 sprays per nostril daily if needed.  Nasal saline spray (i.e. Simply Saline) is recommended prior to medicated nasal sprays and as needed.  Continue cetirizine (Zyrtec) 10 mg daily if needed.  Food allergy  Continue careful avoidance of oranges, lemons, lime, and grapefruit and have access to epinephrine autoinjector 2 pack in case of accidental ingestion.  Food allergy action plan is in place.   Return in about 4 months (around 07/22/2020), or if symptoms worsen or fail to improve.

## 2020-03-21 NOTE — Assessment & Plan Note (Signed)
   Continue careful avoidance of oranges, lemons, lime, and grapefruit and have access to epinephrine autoinjector 2 pack in case of accidental ingestion.  Food allergy action plan is in place.

## 2020-03-21 NOTE — Assessment & Plan Note (Signed)
The patient's history suggests shortness of breath as a result of cardiac etiology, as well as underlying asthma.  A prescription has been provided for Flovent (fluticasone) 110 g, 2 inhalations via spacer device twice a day.  Continue montelukast 10 mg daily at bedtime.  Continue albuterol HFA, 1 to 2 inhalations every 4-6 hours if needed.  Subjective and objective measures of pulmonary function will be followed and the treatment plan will be adjusted accordingly.  The patient is scheduled for evaluation with cardiology in November.

## 2020-03-21 NOTE — Assessment & Plan Note (Signed)
   Continue appropriate allergen avoidance measures and immunotherapy injections per protocol.  Continue montelukast 10 mg daily at bedtime.  Continue fluticasone nasal spray, 2 sprays per nostril daily if needed.  Nasal saline spray (i.e. Simply Saline) is recommended prior to medicated nasal sprays and as needed.  Continue cetirizine (Zyrtec) 10 mg daily if needed.

## 2020-04-06 ENCOUNTER — Ambulatory Visit (INDEPENDENT_AMBULATORY_CARE_PROVIDER_SITE_OTHER): Payer: Medicare Other

## 2020-04-06 DIAGNOSIS — J309 Allergic rhinitis, unspecified: Secondary | ICD-10-CM | POA: Diagnosis not present

## 2020-04-11 ENCOUNTER — Ambulatory Visit (INDEPENDENT_AMBULATORY_CARE_PROVIDER_SITE_OTHER): Payer: Medicare Other

## 2020-04-11 DIAGNOSIS — J309 Allergic rhinitis, unspecified: Secondary | ICD-10-CM

## 2020-04-24 DIAGNOSIS — J301 Allergic rhinitis due to pollen: Secondary | ICD-10-CM

## 2020-04-24 NOTE — Progress Notes (Signed)
VIALS EXP 04-24-21 °

## 2020-04-25 ENCOUNTER — Ambulatory Visit (INDEPENDENT_AMBULATORY_CARE_PROVIDER_SITE_OTHER): Payer: Medicare Other

## 2020-04-25 DIAGNOSIS — J309 Allergic rhinitis, unspecified: Secondary | ICD-10-CM | POA: Diagnosis not present

## 2020-04-26 DIAGNOSIS — J3089 Other allergic rhinitis: Secondary | ICD-10-CM

## 2020-05-22 ENCOUNTER — Encounter: Payer: Self-pay | Admitting: Allergy and Immunology

## 2020-05-22 ENCOUNTER — Ambulatory Visit (INDEPENDENT_AMBULATORY_CARE_PROVIDER_SITE_OTHER): Payer: Medicare Other | Admitting: Allergy and Immunology

## 2020-05-22 ENCOUNTER — Other Ambulatory Visit: Payer: Self-pay

## 2020-05-22 VITALS — BP 120/80 | HR 97 | Temp 98.4°F | Resp 28

## 2020-05-22 DIAGNOSIS — J3089 Other allergic rhinitis: Secondary | ICD-10-CM

## 2020-05-22 DIAGNOSIS — J4540 Moderate persistent asthma, uncomplicated: Secondary | ICD-10-CM

## 2020-05-22 DIAGNOSIS — T7800XD Anaphylactic reaction due to unspecified food, subsequent encounter: Secondary | ICD-10-CM | POA: Diagnosis not present

## 2020-05-22 MED ORDER — AZELASTINE HCL 0.1 % NA SOLN: NASAL | 5 refills | Status: AC

## 2020-05-22 NOTE — Patient Instructions (Addendum)
Moderate persistent asthma The patient's history suggests shortness of breath primarily from atrial fibrillation.  As he has underlying asthma which may be contributing to some extent, we will increase the inhaled corticosteroid.  Increase Flovent (fluticasone) 110 g to 2 inhalations via spacer device twice a day.  Continue montelukast 10 mg daily at bedtime.  Continue albuterol HFA, 1 to 2 inhalations every 4-6 hours if needed.  Subjective and objective measures of pulmonary function will be followed and the treatment plan will be adjusted accordingly.  The patient is to call the cardiologist to see about seeing him at the earliest available date.  Allergic rhinitis  Continue appropriate allergen avoidance measures and immunotherapy injections per protocol.  Continue montelukast 10 mg daily at bedtime.  Continue fluticasone nasal spray, 2 sprays per nostril daily if needed.  A prescription has been provided for azelastine nasal spray, 1-2 sprays per nostril 2 times daily as needed. Proper nasal spray technique has been discussed and demonstrated.   Nasal saline spray (i.e. Simply Saline) is recommended prior to medicated nasal sprays and as needed.  Continue cetirizine (Zyrtec) 10 mg daily if needed.  Food allergy  Continue careful avoidance of oranges, lemons, lime, and grapefruit and have access to epinephrine autoinjector 2 pack in case of accidental ingestion.  Food allergy action plan is in place.   Follow-up with Dr. Lucie Leather in the [facility] office in 2 or 3 months, or sooner if needed.

## 2020-05-22 NOTE — Progress Notes (Signed)
Follow-up Note  RE: Luis Richardson MRN: [id number] DOB: 14-Aug-1960 Date of Office Visit: 05/22/2020  Primary care provider: Selina Cooley, MD Referring provider: Selina Cooley, MD  History of present illness: Luis Richardson is a 59 y.o. male with persistent asthma, allergic rhinitis, and food allergy presenting today for a sick visit.  He is previously seen in this clinic on March 21, 2020.  He reports that he has been "going pretty much downhill" going into atrial fibrillation.  He has been experiencing dyspnea with exertion.  He is scheduled for cardioversion on January 13 but is hoping to have this procedure done earlier.  He does not complain of wheezing or nocturnal awakenings due to lower respiratory symptoms.  He is currently taking Flovent 110 g, 1 inhalation twice daily and montelukast 10 mg daily at bedtime.  He had a sinus infection a week ago despite regular use of fluticasone nasal spray.  Assessment and plan: Moderate persistent asthma The patient's history suggests shortness of breath primarily from atrial fibrillation.  As he has underlying asthma which may be contributing to some extent, we will increase the inhaled corticosteroid.  Increase Flovent (fluticasone) 110 g to 2 inhalations via spacer device twice a day.  Continue montelukast 10 mg daily at bedtime.  Continue albuterol HFA, 1 to 2 inhalations every 4-6 hours if needed.  Subjective and objective measures of pulmonary function will be followed and the treatment plan will be adjusted accordingly.  The patient is to call the cardiologist to see about seeing him at the earliest available date.  Allergic rhinitis  Continue appropriate allergen avoidance measures and immunotherapy injections per protocol.  Continue montelukast 10 mg daily at bedtime.  Continue fluticasone nasal spray, 2 sprays per nostril daily if needed.  A prescription has been provided for azelastine nasal spray, 1-2 sprays  per nostril 2 times daily as needed. Proper nasal spray technique has been discussed and demonstrated.   Nasal saline spray (i.e. Simply Saline) is recommended prior to medicated nasal sprays and as needed.  Continue cetirizine (Zyrtec) 10 mg daily if needed.  Food allergy  Continue careful avoidance of oranges, lemons, lime, and grapefruit and have access to epinephrine autoinjector 2 pack in case of accidental ingestion.  Food allergy action plan is in place.   Meds ordered this encounter  Medications  . azelastine (ASTELIN) 0.1 % nasal spray    Sig: 1-2 sprays per nostril 1-2 times a daily as needed    Dispense:  30 mL    Refill:  5    Diagnostics: Spirometry reveals an FVC of 2.53 L (59% predicted) and an FEV1 of 1.88 L (58% predicted) with an FEV1 ratio of 97%.  There was not significant postbronchodilator improvement.  Please see scanned spirometry results for details.  Physical examination: Blood pressure 120/80, pulse 97, temperature 98.4 F (36.9 C), temperature source Tympanic, resp. rate (!) 28, SpO2 97 %.  General: Alert, interactive, in no acute distress. HEENT: TMs pearly gray, turbinates mildly edematous without discharge, post-pharynx moderately erythematous. Neck: Supple without lymphadenopathy. Lungs: Mildly decreased breath sounds bilaterally without wheezing, rhonchi or rales. CV: Normal S1, S2 without murmurs. Skin: Warm and dry, without lesions or rashes.  The following portions of the patient's history were reviewed and updated as appropriate: allergies, current medications, past family history, past medical history, past social history, past surgical history and problem list.   Current Outpatient Medications  Medication Sig Dispense Refill  . albuterol (PROVENTIL) (2.5 MG/3ML) 0.083%  nebulizer solution Take 3 mLs (2.5 mg total) by nebulization every 4 (four) hours as needed for wheezing or shortness of breath. 75 mL 1  . albuterol (VENTOLIN HFA) 108  (90 Base) MCG/ACT inhaler Inhale 2 puffs into the lungs every 4 (four) hours as needed for wheezing or shortness of breath. 18 g 1  . anastrozole (ARIMIDEX) 1 MG tablet Take 1 mg by mouth daily.    Marland Kitchen aspirin EC 81 MG tablet Take 81 mg by mouth daily.    Marland Kitchen b complex vitamins tablet Take 1 tablet by mouth daily.    . carvedilol (COREG) 3.125 MG tablet Take 3.125 mg by mouth 2 (two) times daily with a meal.    . cetirizine (ZYRTEC) 10 MG tablet TAKE 1 TABLET BY MOUTH EVERY DAY FOR RUNNY NOSE OR ITCHING 30 tablet 0  . Cyanocobalamin (VITAMIN B-12) 2500 MCG SUBL Place under the tongue.    Marland Kitchen EPINEPHrine 0.3 mg/0.3 mL IJ SOAJ injection Inject 0.3 mLs (0.3 mg total) into the muscle as needed for anaphylaxis. 2 each 2  . fluticasone (FLONASE) 50 MCG/ACT nasal spray USE 2 SPRAYS IN EACH NOSTRIL EVERY DAY FOR STUFFY NOSE OR DRAINAGE 48 g 5  . fluticasone (FLOVENT HFA) 110 MCG/ACT inhaler Inhale 2 puffs into the lungs 2 (two) times daily. Use with spacer. Rinse, gargle and spit out after use. 1 each 5  . lisinopril (ZESTRIL) 10 MG tablet Take 10 mg by mouth daily.    Marland Kitchen LORAZEPAM PO Take by mouth.     . metFORMIN (GLUCOPHAGE) 500 MG tablet TAKE 1 TABLET BY MOUTH EVERY EVENING FORDIABETES    . rosuvastatin (CRESTOR) 10 MG tablet Take 10 mg by mouth daily.    Marland Kitchen testosterone cypionate (DEPOTESTOSTERONE CYPIONATE) 200 MG/ML injection Inject 200 mg into the muscle every 14 (fourteen) days.    Jeananne Rama Sulfate (VISINE-AC OP) Apply to eye.    . traZODone (DESYREL) 50 MG tablet Take 50 mg by mouth at bedtime.    . WARFARIN SODIUM PO Take by mouth.    Marland Kitchen azelastine (ASTELIN) 0.1 % nasal spray 1-2 sprays per nostril 1-2 times a daily as needed 30 mL 5  . glucose blood test strip -TEST ONCE DAILY OR AS PHYSICIAN INSTRUCTED (E119 NIDDM) (Patient not taking: Reported on 05/22/2020)    . ipratropium-albuterol (DUONEB) 0.5-2.5 (3) MG/3ML SOLN Take 3 mLs by nebulization every 6 (six) hours as needed. (Patient not  taking: Reported on 05/22/2020) 90 mL 1  . olopatadine (PATANOL) 0.1 % ophthalmic solution Place 1 drop into both eyes 2 (two) times daily as needed (itchy/watery eyes). (Patient not taking: Reported on 05/22/2020) 5 mL 5   No current facility-administered medications for this visit.    Allergies  Allergen Reactions  . Grapefruit Extract Other (See Comments)    Blisters all over body Blisters all over body  . Grapeseed Extract [Nutritional Supplements]     Fresh grapes lips swells up  . Lemon Oil Other (See Comments)    Blisters all over body Blisters all over body  . Lime, Sulfurated Other (See Comments)    Blisters all over body Blisters all over body  . Orange (Diagnostic) Other (See Comments)    Blisters all over body  . Orange Oil     Blisters all over body  . Penicillin G     Syncope, rash  . Penicillins   . Sildenafil Other (See Comments)    SOB and chest discomfort   . Topiramate Other (  See Comments)   Review of systems: Review of systems negative except as noted in HPI / PMHx.  Past Medical History:  Diagnosis Date  . Allergic rhinitis   . Asthma   . Hypertension   . Urticaria     Family History  Problem Relation Age of Onset  . Cancer Mother   . Hypertension Mother   . Asthma Father   . Hypertension Father   . Hearing loss Father   . Hypertension Brother   . Angioedema Neg Hx   . Allergic rhinitis Neg Hx   . Eczema Neg Hx   . Immunodeficiency Neg Hx   . Urticaria Neg Hx     Social History   Socioeconomic History  . Marital status: Married    Spouse name: Not on file  . Number of children: Not on file  . Years of education: Not on file  . Highest education level: Not on file  Occupational History  . Not on file  Tobacco Use  . Smoking status: Never Smoker  . Smokeless tobacco: Current User    Types: Snuff  Vaping Use  . Vaping Use: Never used  Substance and Sexual Activity  . Alcohol use: Yes    Alcohol/week: 0.0 standard drinks  .  Drug use: No  . Sexual activity: Not on file  Other Topics Concern  . Not on file  Social History Narrative  . Not on file   Social Determinants of Health   Financial Resource Strain: Not on file  Food Insecurity: Not on file  Transportation Needs: Not on file  Physical Activity: Not on file  Stress: Not on file  Social Connections: Not on file  Intimate Partner Violence: Not on file    I appreciate the opportunity to take part in Sy's care. Please do not hesitate to contact me with questions.  Sincerely,   R. Jorene Guest, MD

## 2020-05-22 NOTE — Assessment & Plan Note (Signed)
   Continue appropriate allergen avoidance measures and immunotherapy injections per protocol.  Continue montelukast 10 mg daily at bedtime.  Continue fluticasone nasal spray, 2 sprays per nostril daily if needed.  A prescription has been provided for azelastine nasal spray, 1-2 sprays per nostril 2 times daily as needed. Proper nasal spray technique has been discussed and demonstrated.   Nasal saline spray (i.e. Simply Saline) is recommended prior to medicated nasal sprays and as needed.  Continue cetirizine (Zyrtec) 10 mg daily if needed.

## 2020-05-22 NOTE — Assessment & Plan Note (Signed)
   Continue careful avoidance of oranges, lemons, lime, and grapefruit and have access to epinephrine autoinjector 2 pack in case of accidental ingestion.  Food allergy action plan is in place. 

## 2020-05-22 NOTE — Assessment & Plan Note (Addendum)
The patient's history suggests shortness of breath primarily from atrial fibrillation.  As he has underlying asthma which may be contributing to some extent, we will increase the inhaled corticosteroid.  Increase Flovent (fluticasone) 110 g to 2 inhalations via spacer device twice a day.  Continue montelukast 10 mg daily at bedtime.  Continue albuterol HFA, 1 to 2 inhalations every 4-6 hours if needed.  Subjective and objective measures of pulmonary function will be followed and the treatment plan will be adjusted accordingly.  The patient is to call the cardiologist to see about seeing him at the earliest available date.

## 2020-06-08 ENCOUNTER — Other Ambulatory Visit: Payer: Self-pay | Admitting: Allergy and Immunology

## 2020-07-26 ENCOUNTER — Ambulatory Visit: Payer: Medicare Other | Admitting: Allergy and Immunology

## 2020-07-26 ENCOUNTER — Ambulatory Visit: Payer: Medicare Other | Admitting: Allergy

## 2020-10-28 IMAGING — CT CT CHEST HIGH RESOLUTION W/O CM
2 of 5 series · 14 of 36 positions shown, 17 images · non-contrast
Comparison: 02/17/2018 chest radiograph.

CLINICAL DATA: Dyspnea on exertion for 2 months. Clinical concern
for interstitial lung disease. History of asthma.

EXAM:
CT CHEST WITHOUT CONTRAST
TECHNIQUE: Multidetector CT imaging of the chest was performed following the
standard protocol without intravenous contrast. High resolution
imaging of the lungs, as well as inspiratory and expiratory imaging,
was performed.

[Series 2: high resolution · axial · 0.83mm/px · z∈[-302,-26]mm · 11 of 159 slices shown, 14 images]
[im 14/159  mediastinal]
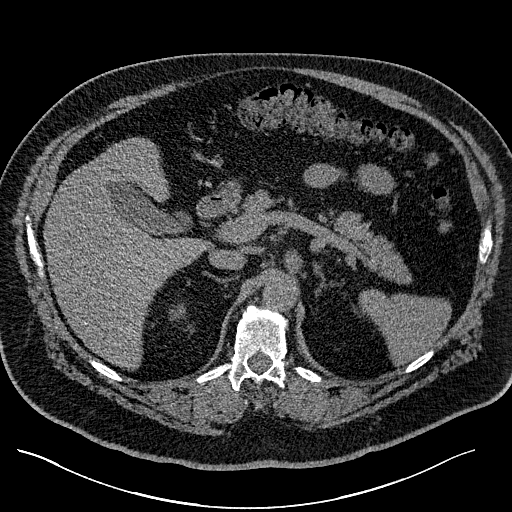
[im 14/159  lung]
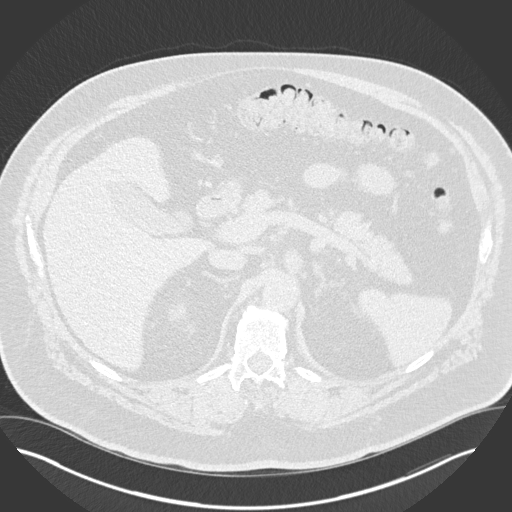
[im 28/159  lung]
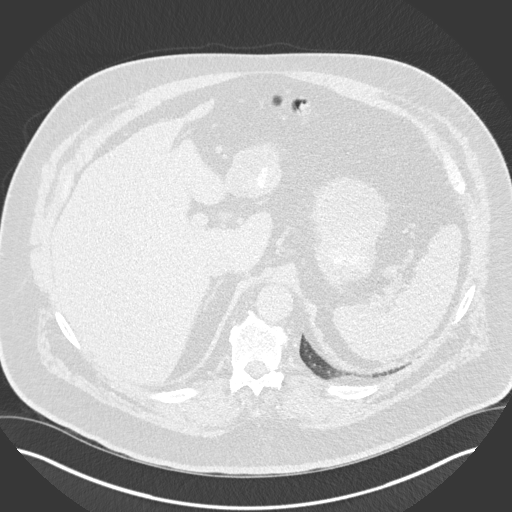
[im 42/159  lung]
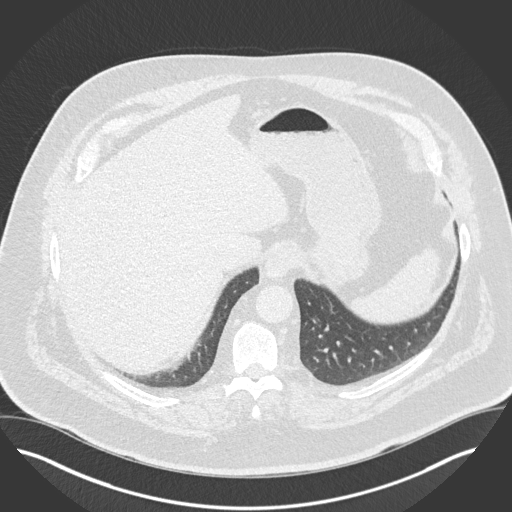
[im 55/159  lung]
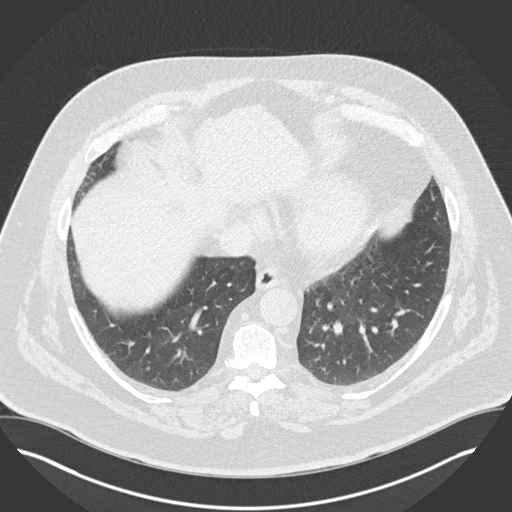
[im 69/159  mediastinal]
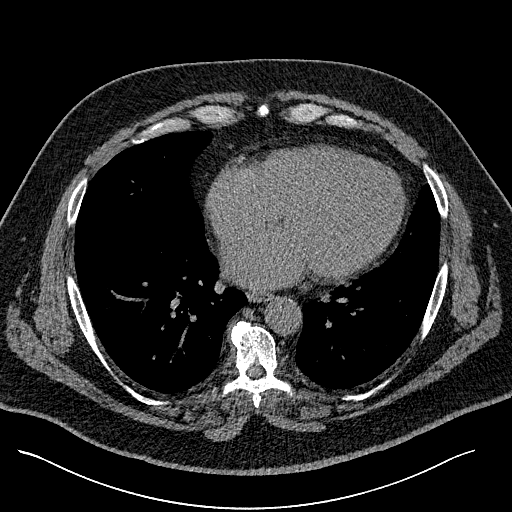
[im 69/159  lung]
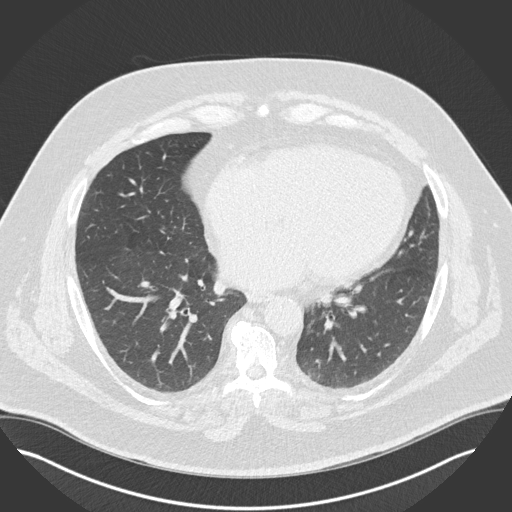
[im 83/159  lung]
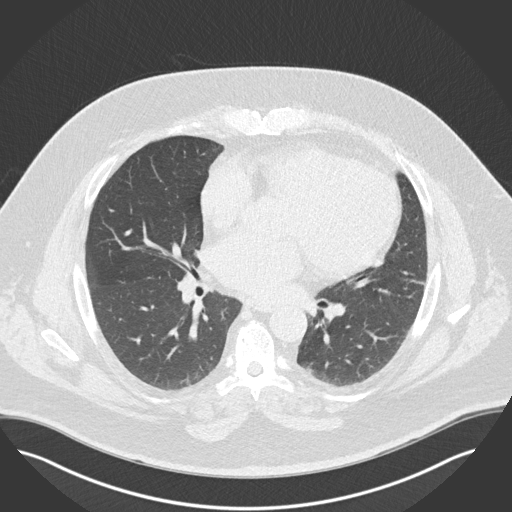
[im 97/159  lung]
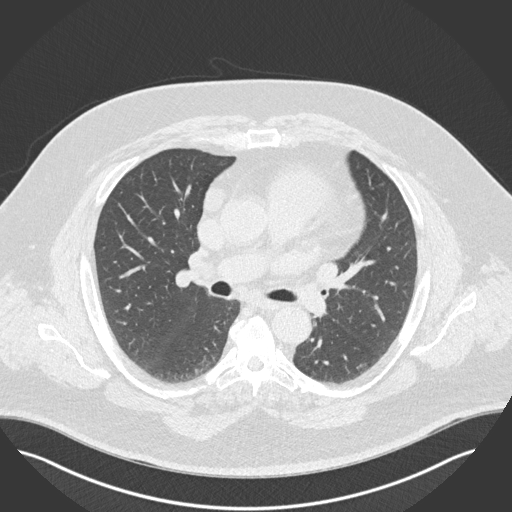
[im 110/159  lung]
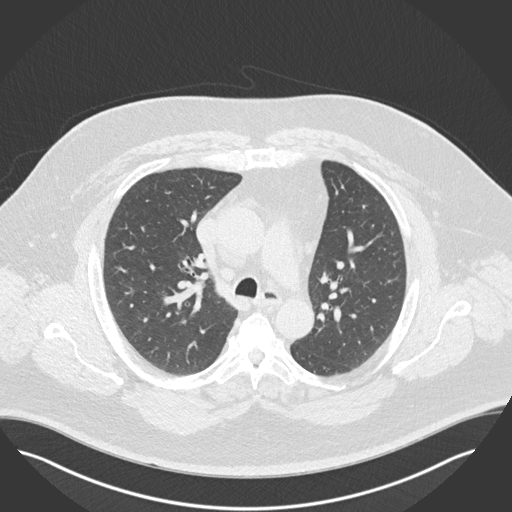
[im 124/159  mediastinal]
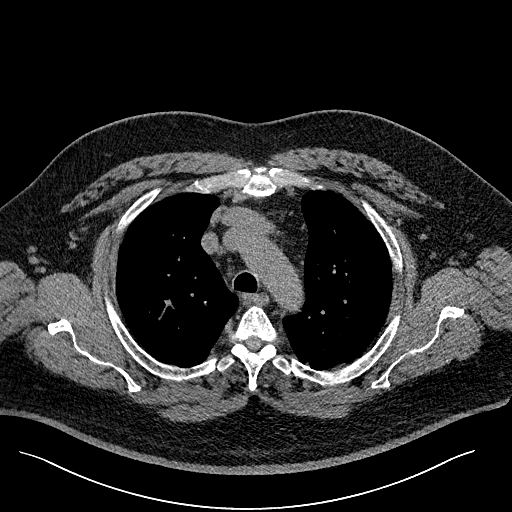
[im 124/159  lung]
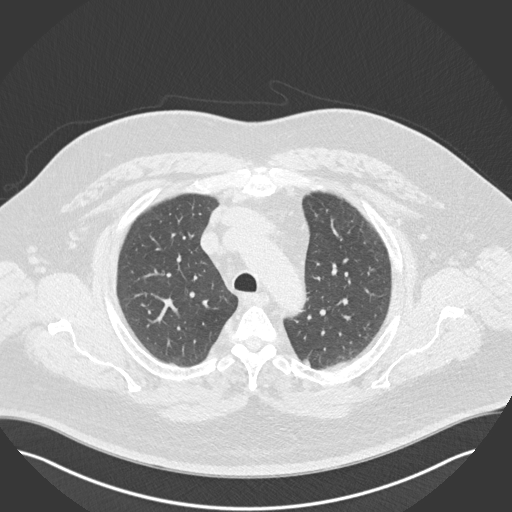
[im 138/159  lung]
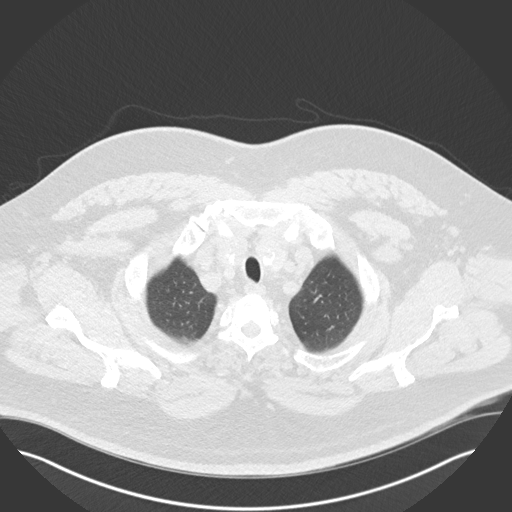
[im 152/159  lung]
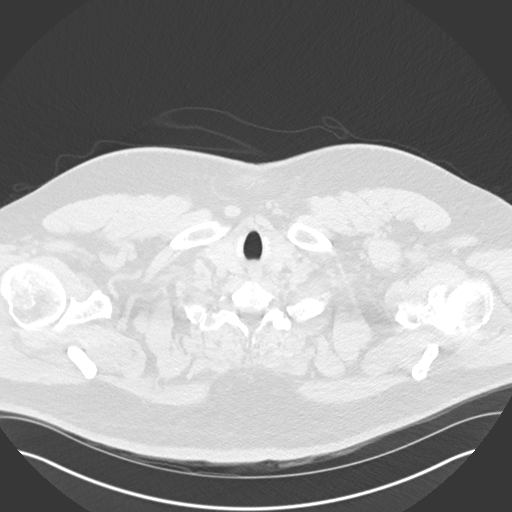

[Series 9: coronal · coronal · 0.66mm/px · 3 of 150 slices shown]
[im 30/150  lung]
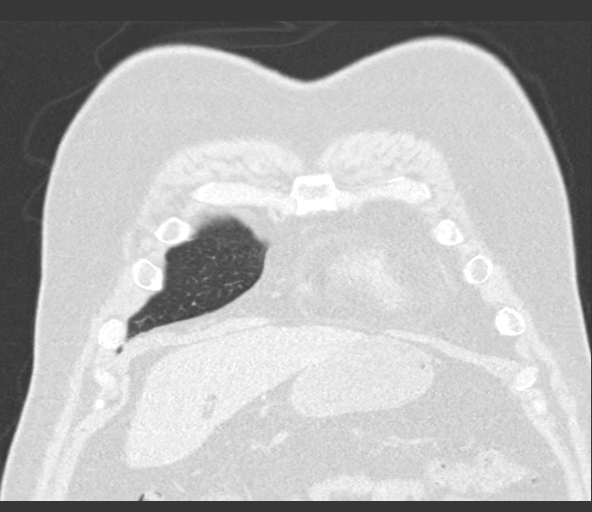
[im 60/150  lung]
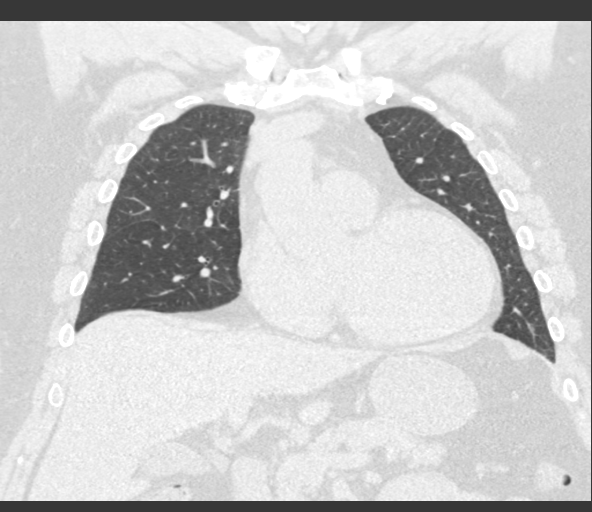
[im 90/150  lung]
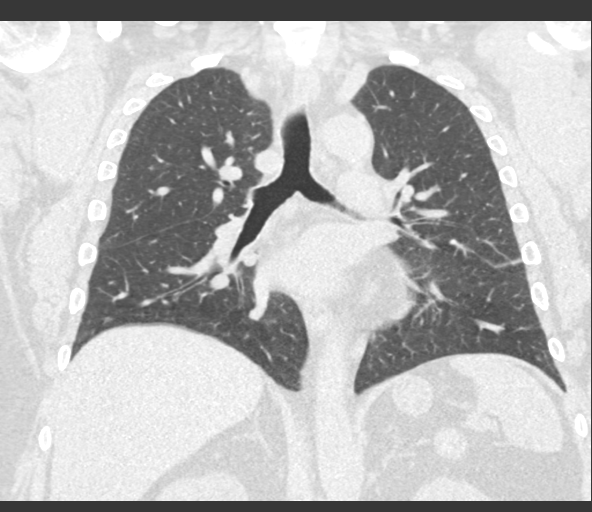

[14 of 36 positions shown; findings below may reference images not displayed]

FINDINGS: Cardiovascular: Mild cardiomegaly. No significant pericardial
effusion/thickening. Mildly atherosclerotic nonaneurysmal thoracic
aorta. Normal caliber pulmonary arteries.

Mediastinum/Nodes: Multinodular goiter with heterogeneously
calcified thyroid nodules bilaterally, largest 2.0 cm in the lower
left thyroid lobe. Unremarkable esophagus. No pathologically
enlarged axillary, mediastinal or hilar lymph nodes, noting limited
sensitivity for the detection of hilar adenopathy on this
noncontrast study. There is a simple cystic 2.2 x 1.4 cm lesion in
the right anterior mediastinum abutting the pericardium (series
2/image 63).

Lungs/Pleura: No pneumothorax. No pleural effusion. No acute
consolidative airspace disease or lung masses. Subpleural 5 mm solid
posterior left upper lobe pulmonary nodule (series 3/image 35). No
additional significant pulmonary nodules. Diffuse bronchial wall
thickening. No significant air trapping on the expiration sequence.
No significant regions of subpleural reticulation, ground-glass
attenuation, traction bronchiectasis, parenchymal banding,
architectural distortion or frank honeycombing. Minimal interlobular
septal thickening is noted throughout both lungs.

Upper abdomen: No acute abnormality.

Musculoskeletal: No aggressive appearing focal osseous lesions. Mild
thoracic spondylosis.
IMPRESSION: 1. No convincing findings of interstitial lung disease at this time.
2. Mild cardiomegaly. Minimal interlobular septal thickening
throughout both lungs, cannot exclude minimal pulmonary edema.
3. Simple 2.2 cm cystic lesion in the right anterior mediastinum
abutting the pericardium. Differential includes pericardial cyst or
cystic thymic lesion. Suggest initial follow-up chest CT with IV
contrast in 3-6 months to document stability.
4. Multinodular goiter. Dominant calcified 2.0 cm lower left thyroid
lobe nodule. Thyroid ultrasound correlation is indicated. This
follows ACR consensus guidelines: Managing Incidental Thyroid
Nodules Detected on Imaging: White Paper of [REDACTED]. [HOSPITAL] 4316; [DATE].
5. Subpleural 5 mm solid posterior left upper lobe pulmonary nodule.
No follow-up needed if patient is low-risk. Non-contrast chest CT
can be considered in 12 months if patient is high-risk. This
recommendation follows the consensus statement: Guidelines for
Management of Incidental Pulmonary Nodules Detected on CT
Images:From the [HOSPITAL] 0139; published online before
print (10.1148/[id number]).

Aortic Atherosclerosis ([id number]-DG7.7).

## 2022-04-22 NOTE — Progress Notes (Signed)
Appointment cancelled - please disregard.
